# Patient Record
Sex: Male | Born: 1975 | Race: White | Hispanic: No | Marital: Married | Smoking: Never smoker
Health system: Southern US, Community
[De-identification: ages and names within clinical notes are randomized; demographics above are authoritative.]

## PROBLEM LIST (undated history)

## (undated) DIAGNOSIS — K429 Umbilical hernia without obstruction or gangrene: Secondary | ICD-10-CM

## (undated) DIAGNOSIS — Z8719 Personal history of other diseases of the digestive system: Secondary | ICD-10-CM

## (undated) DIAGNOSIS — Z8711 Personal history of peptic ulcer disease: Secondary | ICD-10-CM

## (undated) DIAGNOSIS — J302 Other seasonal allergic rhinitis: Secondary | ICD-10-CM

## (undated) HISTORY — PX: UPPER GI ENDOSCOPY: SHX6162

---

## 2006-01-17 ENCOUNTER — Encounter: Admission: RE | Admit: 2006-01-17 | Discharge: 2006-01-17 | Payer: Self-pay | Admitting: Internal Medicine

## 2006-01-26 ENCOUNTER — Encounter: Admission: RE | Admit: 2006-01-26 | Discharge: 2006-04-26 | Payer: Self-pay | Admitting: Internal Medicine

## 2011-09-15 ENCOUNTER — Emergency Department (HOSPITAL_BASED_OUTPATIENT_CLINIC_OR_DEPARTMENT_OTHER)
Admission: EM | Admit: 2011-09-15 | Discharge: 2011-09-15 | Disposition: A | Discharge status: Home / Self Care | Payer: BC Managed Care – PPO | Attending: Emergency Medicine | Admitting: Emergency Medicine

## 2011-09-15 ENCOUNTER — Encounter: Payer: Self-pay | Admitting: Family Medicine

## 2011-09-15 ENCOUNTER — Emergency Department (INDEPENDENT_AMBULATORY_CARE_PROVIDER_SITE_OTHER): Payer: BC Managed Care – PPO

## 2011-09-15 DIAGNOSIS — K5732 Diverticulitis of large intestine without perforation or abscess without bleeding: Secondary | ICD-10-CM

## 2011-09-15 DIAGNOSIS — K5792 Diverticulitis of intestine, part unspecified, without perforation or abscess without bleeding: Secondary | ICD-10-CM

## 2011-09-15 DIAGNOSIS — R1032 Left lower quadrant pain: Secondary | ICD-10-CM | POA: Insufficient documentation

## 2011-09-15 LAB — CBC
MCH: 31 pg (ref 26.0–34.0)
Platelets: 244 10*3/uL (ref 150–400)
RBC: 5.09 MIL/uL (ref 4.22–5.81)
WBC: 9.6 10*3/uL (ref 4.0–10.5)

## 2011-09-15 LAB — URINALYSIS, ROUTINE W REFLEX MICROSCOPIC
Bilirubin Urine: NEGATIVE
Hgb urine dipstick: NEGATIVE
Ketones, ur: NEGATIVE mg/dL
Nitrite: NEGATIVE
Protein, ur: NEGATIVE mg/dL
Specific Gravity, Urine: 1.01 (ref 1.005–1.030)
Urobilinogen, UA: 1 mg/dL (ref 0.0–1.0)
pH: 7.5 (ref 5.0–8.0)

## 2011-09-15 LAB — COMPREHENSIVE METABOLIC PANEL
AST: 25 U/L (ref 0–37)
Albumin: 4.5 g/dL (ref 3.5–5.2)
Alkaline Phosphatase: 69 U/L (ref 39–117)
CO2: 30 mEq/L (ref 19–32)
Calcium: 9.4 mg/dL (ref 8.4–10.5)
GFR calc Af Amer: >90 mL/min (ref >90)
GFR calc non Af Amer: >90 mL/min (ref >90)
Glucose, Bld: 100 mg/dL — ABNORMAL HIGH (ref 70–99)
Sodium: 141 mEq/L (ref 135–145)
Total Bilirubin: 0.8 mg/dL (ref 0.3–1.2)

## 2011-09-15 LAB — DIFFERENTIAL
Basophils Absolute: 0 10*3/uL (ref 0.0–0.1)
Eosinophils Absolute: 0.2 10*3/uL (ref 0.0–0.7)
Lymphocytes Relative: 17 % (ref 12–46)
Lymphs Abs: 1.6 10*3/uL (ref 0.7–4.0)
Monocytes Relative: 8 % (ref 3–12)
Neutrophils Relative %: 73 % (ref 43–77)

## 2011-09-15 LAB — LIPASE, BLOOD: Lipase: 19 U/L (ref 11–59)

## 2011-09-15 MED ORDER — CIPROFLOXACIN HCL 500 MG PO TABS: 500.0000 mg | ORAL_TABLET | Freq: Two times a day (BID) | ORAL | Status: AC

## 2011-09-15 MED ORDER — IOHEXOL 300 MG/ML  SOLN
100.0000 mL | Freq: Once | INTRAMUSCULAR | Status: AC | PRN
  Administered 2011-09-15: 100 mL via INTRAVENOUS

## 2011-09-15 MED ORDER — SODIUM CHLORIDE 0.9 % IV BOLUS (SEPSIS)
1000.0000 mL | Freq: Once | INTRAVENOUS | Status: AC
  Administered 2011-09-15: 1000 mL via INTRAVENOUS

## 2011-09-15 MED ORDER — METRONIDAZOLE 500 MG PO TABS: 500.0000 mg | ORAL_TABLET | Freq: Two times a day (BID) | ORAL | Status: AC

## 2011-09-15 MED ORDER — METRONIDAZOLE 500 MG PO TABS
500.0000 mg | ORAL_TABLET | Freq: Once | ORAL | Status: AC
  Administered 2011-09-15: 500 mg via ORAL
  Filled 2011-09-15: qty 1

## 2011-09-15 MED ORDER — OXYCODONE-ACETAMINOPHEN 5-325 MG PO TABS: 1.0000 | ORAL_TABLET | Freq: Four times a day (QID) | ORAL | Status: AC | PRN

## 2011-09-15 MED ORDER — CIPROFLOXACIN HCL 500 MG PO TABS
500.0000 mg | ORAL_TABLET | Freq: Once | ORAL | Status: AC
  Administered 2011-09-15: 500 mg via ORAL
  Filled 2011-09-15: qty 1

## 2011-09-15 NOTE — ED Notes (Signed)
Pt c/o LLQ pain since Friday night, pt sts "I initially thought it was a muscle strain". Pt reports "ribbon-like stool". Pt denies n/v, fever. Pt reports increased pain while "bending over". Pt sts "gastritis when I was young".

## 2011-09-15 NOTE — ED Provider Notes (Signed)
History     CSN: [id number] Arrival date & time: 09/15/2011 10:20 AM  Chief Complaint  Patient presents with  . Abdominal Pain    (Consider location/radiation/quality/duration/timing/severity/associated sxs/prior treatment) HPI Pt reports 2-3 days of moderate aching LLQ abdominal pain, persistent during that time but now sharp and getting worse this morning. Pain is worse with movements such as bending over and associated with change in stool caliber but no blood in stool. He has not had any nausea or vomiting. No fever.  Past Medical History  Diagnosis Date  . Gastritis     History reviewed. No pertinent past surgical history.  No family history on file.  History  Substance Use Topics  . Smoking status: Never Smoker   . Smokeless tobacco: Not on file  . Alcohol Use: No      Review of Systems All other systems reviewed and are negative except as noted in HPI.   Allergies  Review of patient's allergies indicates no known allergies.  Home Medications  No current outpatient prescriptions on file.  BP 134/89  Pulse 70  Temp(Src) 97.1 F (36.2 C) (Oral)  Resp 16  Ht 5\' 10"  (1.778 m)  Wt 203 lb (92.08 kg)  BMI 29.13 kg/m2  SpO2 100%  Physical Exam  Nursing note and vitals reviewed. Constitutional: He is oriented to person, place, and time. He appears well-developed and well-nourished.  HENT:  Head: Normocephalic and atraumatic.  Eyes: EOM are normal. Pupils are equal, round, and reactive to light.  Neck: Normal range of motion. Neck supple.  Cardiovascular: Normal rate, normal heart sounds and intact distal pulses.   Pulmonary/Chest: Effort normal and breath sounds normal.  Abdominal: Soft. Bowel sounds are normal. He exhibits no distension and no mass. There is tenderness (LLQ tenderness mild-moderate). There is guarding. There is no rebound.  Musculoskeletal: Normal range of motion. He exhibits no edema and no tenderness.  Neurological: He is alert and  oriented to person, place, and time. He has normal strength. No cranial nerve deficit or sensory deficit.  Skin: Skin is warm and dry. No rash noted.  Psychiatric: He has a normal mood and affect.    ED Course  Procedures (including critical care time)  Labs Reviewed  COMPREHENSIVE METABOLIC PANEL - Abnormal; Notable for the following:    Glucose, Bld 100 (*)    All other components within normal limits  URINALYSIS, ROUTINE W REFLEX MICROSCOPIC  CBC  DIFFERENTIAL  LIPASE, BLOOD   Ct Abdomen Pelvis W Contrast  09/15/2011  *RADIOLOGY REPORT*  Clinical Data: Left lower quadrant abdominal pain  CT ABDOMEN AND PELVIS WITH CONTRAST  Technique:  Multidetector CT imaging of the abdomen and pelvis was performed following the standard protocol during bolus administration of intravenous contrast.  Contrast: OMNIPAQUE IOHEXOL 300 MG/ML IV SOLN  Comparison: None.  Findings: Mild dependent atelectasis at the lung bases.  Liver, spleen, pancreas, and adrenal glands are within normal limits.  Gallbladder is unremarkable.  No intrahepatic or extrahepatic ductal dilatation.  Kidneys are within normal limits.  No hydronephrosis.  No evidence of bowel obstruction.  Normal appendix.  Colonic diverticulosis with mild inflammatory stranding involving a loop of sigmoid colon (series 2/image 74), compatible with mild sigmoid diverticulitis.  No drainable fluid collection or abscess.  No free air.  No evidence of abdominal aortic aneurysm.  No abdominopelvic ascites.  No suspicious abdominopelvic lymphadenopathy.  Prostate is unremarkable.  Bladder is within normal limits.  Visualized osseous structures are within normal limits.  IMPRESSION: Mild sigmoid diverticulitis.  No drainable fluid collection or abscess.  No free air.  Original Report Authenticated By: Charline Bills, M.D.   Impression  1. Diverticulitis       MDM  CT shows mild diverticulitis consistent with history and exam. Will treat as  outpatient with oral Abx and pain meds. Advised to return to the ED for any worsening pain, vomiting, fever or for any other concerns.         Charles B. Bernette Mayers, MD 09/15/11 1234

## 2015-02-06 DIAGNOSIS — K429 Umbilical hernia without obstruction or gangrene: Secondary | ICD-10-CM

## 2015-02-06 HISTORY — DX: Umbilical hernia without obstruction or gangrene: K42.9

## 2015-02-09 ENCOUNTER — Encounter (HOSPITAL_BASED_OUTPATIENT_CLINIC_OR_DEPARTMENT_OTHER): Payer: Self-pay | Admitting: *Deleted

## 2015-02-09 ENCOUNTER — Ambulatory Visit (INDEPENDENT_AMBULATORY_CARE_PROVIDER_SITE_OTHER): Payer: Self-pay | Admitting: General Surgery

## 2015-02-09 NOTE — H&P (Signed)
Quame Strength 01/04/2015 3:06 PM Location: [facility] Patient #: [id number] DOB: 01/19/1976 Married / Language: English / Race: White Male  History of Present Illness (Raena Pau M. Zameria Vogl MD; 01/04/2015 3:43 PM) Patient words: eval umb hernia.  The patient is a 39 year old male who presents with an umbilical hernia. He is referred by Dr. Dean Mitchell. He states that he probably has had it for 2 years. He reports that he felt something pop while swinging his daughter on a swing set. He reports that it will cause some intermittent discomfort. For example he had a case of gastroenteritis last week which caused a fair amount of abdominal bloating and pressure which made his umbilicus hurt. It also made him leave work early last week. For the most part it doesn't cause significant pain or discomfort. He denies any fever, chills, nausea, vomiting, diarrhea or constipation. He denies any prior abdominal surgery. He does have problems with back pain   Other Problems (Fatumata Kashani M Jossalin Chervenak, MD; 01/04/2015 3:44 PM) Back Pain Diverticulosis Gastric Ulcer Hemorrhoids Umbilical Hernia Repair UMBILICAL HERNIA WITHOUT OBSTRUCTION AND WITHOUT GANGRENE (553.1  K42.9)  Past Surgical History (Kendall Moffitt, MA; 01/04/2015 3:06 PM) No pertinent past surgical history  Diagnostic Studies History (Kendall Moffitt, MA; 01/04/2015 3:06 PM) Colonoscopy never  Allergies (Kendall Moffitt, MA; 01/04/2015 3:07 PM) No Known Drug Allergies01/28/2016  Medication History (Kendall Moffitt, MA; 01/04/2015 3:07 PM) Azelastine HCl (0.15% Solution, Nasal as needed) Active. Diclofenac Sodium (75MG Tablet DR, Oral daily) Active. Dymista (137-50MCG/ACT Suspension, Nasal) Active.  Social History (Kendall Moffitt, MA; 01/04/2015 3:06 PM) Alcohol use Occasional alcohol use. Caffeine use Carbonated beverages, Coffee, Tea. No drug use Tobacco use Never smoker.  Family History (Kendall Moffitt, MA;  01/04/2015 3:06 PM) Colon Polyps Father. Heart disease in male family member before age 65 Heart disease in male family member before age 55  Review of Systems (Kendall Moffitt MA; 01/04/2015 3:06 PM) General Not Present- Appetite Loss, Chills, Fatigue, Fever, Night Sweats, Weight Gain and Weight Loss. Skin Not Present- Change in Wart/Mole, Dryness, Hives, Jaundice, New Lesions, Non-Healing Wounds, Rash and Ulcer. HEENT Not Present- Earache, Hearing Loss, Hoarseness, Nose Bleed, Oral Ulcers, Ringing in the Ears, Seasonal Allergies, Sinus Pain, Sore Throat, Visual Disturbances, Wears glasses/contact lenses and Yellow Eyes. Respiratory Not Present- Bloody sputum, Chronic Cough, Difficulty Breathing, Snoring and Wheezing. Breast Not Present- Breast Mass, Breast Pain, Nipple Discharge and Skin Changes. Cardiovascular Not Present- Chest Pain, Difficulty Breathing Lying Down, Leg Cramps, Palpitations, Rapid Heart Rate, Shortness of Breath and Swelling of Extremities. Gastrointestinal Present- Abdominal Pain. Not Present- Bloating, Bloody Stool, Change in Bowel Habits, Chronic diarrhea, Constipation, Difficulty Swallowing, Excessive gas, Gets full quickly at meals, Hemorrhoids, Indigestion, Nausea, Rectal Pain and Vomiting. Male Genitourinary Not Present- Blood in Urine, Change in Urinary Stream, Frequency, Impotence, Nocturia, Painful Urination, Urgency and Urine Leakage. Musculoskeletal Present- Back Pain and Joint Pain. Not Present- Joint Stiffness, Muscle Pain, Muscle Weakness and Swelling of Extremities. Neurological Not Present- Decreased Memory, Fainting, Headaches, Numbness, Seizures, Tingling, Tremor, Trouble walking and Weakness. Psychiatric Not Present- Anxiety, Bipolar, Change in Sleep Pattern, Depression, Fearful and Frequent crying. Endocrine Not Present- Cold Intolerance, Excessive Hunger, Hair Changes, Heat Intolerance, Hot flashes and New Diabetes. Hematology Not Present- Easy  Bruising, Excessive bleeding, Gland problems, HIV and Persistent Infections.   Vitals (Kendall Moffitt MA; 01/04/2015 3:07 PM) 01/04/2015 3:07 PM Weight: 208.6 lb Height: 70in Body Surface Area: 2.16 m Body Mass Index: 29.93 kg/m Temp.: 98.3F(Oral)  Pulse: 64 (Regular)    Resp.: 16 (Unlabored)  BP: 142/84 (Sitting, Left Arm, Standard)    Physical Exam (Kerry-Anne Mezo M. Laurel Smeltz MD; 01/04/2015 3:41 PM) General Mental Status-Alert. General Appearance-Consistent with stated age. Hydration-Well hydrated. Voice-Normal.  Head and Neck Head-normocephalic, atraumatic with no lesions or palpable masses. Trachea-midline. Thyroid Gland Characteristics - normal size and consistency.  Eye Eyeball - Bilateral-Extraocular movements intact. Sclera/Conjunctiva - Bilateral-No scleral icterus.  Chest and Lung Exam Chest and lung exam reveals -quiet, even and easy respiratory effort with no use of accessory muscles and on auscultation, normal breath sounds, no adventitious sounds and normal vocal resonance. Inspection Chest Wall - Normal. Back - normal.  Breast - Did not examine.  Cardiovascular Cardiovascular examination reveals -normal heart sounds, regular rate and rhythm with no murmurs and normal pedal pulses bilaterally.  Abdomen Inspection  Inspection of the abdomen reveals: Note: small umbilical hernia; about 0.7cm. flat. nothing protruding. Skin - Scar - no surgical scars. Palpation/Percussion Palpation and Percussion of the abdomen reveal - Soft, Non Tender, No Rebound tenderness, No Rigidity (guarding) and No hepatosplenomegaly. Auscultation Auscultation of the abdomen reveals - Bowel sounds normal.  Peripheral Vascular Upper Extremity Palpation - Pulses bilaterally normal.  Neurologic Neurologic evaluation reveals -alert and oriented x 3 with no impairment of recent or remote memory. Mental Status-Normal.  Neuropsychiatric The patient's mood  and affect are described as -normal. Judgment and Insight-insight is appropriate concerning matters relevant to self.  Musculoskeletal Normal Exam - Left-Upper Extremity Strength Normal and Lower Extremity Strength Normal. Normal Exam - Right-Upper Extremity Strength Normal and Lower Extremity Strength Normal.  Lymphatic Head & Neck  General Head & Neck Lymphatics: Bilateral - Description - Normal. Axillary - Did not examine. Femoral & Inguinal - Did not examine.    Assessment & Plan (Maanav Kassabian M. Bailynn Dyk MD; 01/04/2015 3:40 PM) UMBILICAL HERNIA WITHOUT OBSTRUCTION AND WITHOUT GANGRENE (553.1  K42.9) Current Plans  Schedule for Surgery We discussed the etiology of umbilical hernias. We discussed the signs and symptoms of incarceration and strangulation. The patient was given educational material. I also drew diagrams.  We discussed nonoperative and operative management. With respect to operative management, we discussed open repair  We discussed the risk and benefits of surgery including but not limited to bleeding, infection, injury to surrounding structures, hernia recurrence, mesh complications, hematoma/seroma formation, blood clot formation, urinary retention, post operative ileus, general anesthesia risk, abdominal pain. We discussed the importance of avoiding heavy lifting and straining for a period of 4- 6 weeks.  The patient has elected to proceed with OPEN REPAIR OF UMBILICAL HERNIA, POSSIBLE MESH.  Floretta Petro M. Renn Stille, MD, FACS General, Bariatric, & Minimally Invasive Surgery [facility]  

## 2015-02-16 ENCOUNTER — Ambulatory Visit (HOSPITAL_BASED_OUTPATIENT_CLINIC_OR_DEPARTMENT_OTHER): Payer: BLUE CROSS/BLUE SHIELD | Admitting: Anesthesiology

## 2015-02-16 ENCOUNTER — Ambulatory Visit (HOSPITAL_BASED_OUTPATIENT_CLINIC_OR_DEPARTMENT_OTHER)
Admission: RE | Admit: 2015-02-16 | Discharge: 2015-02-16 | Disposition: A | Discharge status: Home / Self Care | Payer: BLUE CROSS/BLUE SHIELD | Source: Ambulatory Visit | Attending: General Surgery | Admitting: General Surgery

## 2015-02-16 ENCOUNTER — Encounter (HOSPITAL_BASED_OUTPATIENT_CLINIC_OR_DEPARTMENT_OTHER): Payer: Self-pay

## 2015-02-16 ENCOUNTER — Encounter (HOSPITAL_BASED_OUTPATIENT_CLINIC_OR_DEPARTMENT_OTHER)
Admission: RE | Disposition: A | Discharge status: Home / Self Care | Payer: Self-pay | Source: Ambulatory Visit | Attending: General Surgery

## 2015-02-16 DIAGNOSIS — Z6829 Body mass index (BMI) 29.0-29.9, adult: Secondary | ICD-10-CM | POA: Insufficient documentation

## 2015-02-16 DIAGNOSIS — E669 Obesity, unspecified: Secondary | ICD-10-CM | POA: Insufficient documentation

## 2015-02-16 DIAGNOSIS — K429 Umbilical hernia without obstruction or gangrene: Secondary | ICD-10-CM | POA: Diagnosis not present

## 2015-02-16 DIAGNOSIS — Z79899 Other long term (current) drug therapy: Secondary | ICD-10-CM | POA: Insufficient documentation

## 2015-02-16 DIAGNOSIS — K259 Gastric ulcer, unspecified as acute or chronic, without hemorrhage or perforation: Secondary | ICD-10-CM | POA: Diagnosis not present

## 2015-02-16 HISTORY — DX: Personal history of other diseases of the digestive system: Z87.19

## 2015-02-16 HISTORY — PX: UMBILICAL HERNIA REPAIR: SHX196

## 2015-02-16 HISTORY — DX: Personal history of peptic ulcer disease: Z87.11

## 2015-02-16 HISTORY — DX: Other seasonal allergic rhinitis: J30.2

## 2015-02-16 HISTORY — DX: Umbilical hernia without obstruction or gangrene: K42.9

## 2015-02-16 LAB — POCT HEMOGLOBIN-HEMACUE: Hemoglobin: 16.4 g/dL (ref 13.0–17.0)

## 2015-02-16 SURGERY — REPAIR, HERNIA, UMBILICAL, ADULT
Anesthesia: General | Site: Abdomen

## 2015-02-16 MED ORDER — FENTANYL CITRATE 0.05 MG/ML IJ SOLN
INTRAMUSCULAR | Status: DC | PRN
  Administered 2015-02-16 (×4): 25 ug via INTRAVENOUS
  Administered 2015-02-16: 100 ug via INTRAVENOUS

## 2015-02-16 MED ORDER — 0.9 % SODIUM CHLORIDE (POUR BTL) OPTIME
TOPICAL | Status: DC | PRN
  Administered 2015-02-16: 200 mL

## 2015-02-16 MED ORDER — PROMETHAZINE HCL 25 MG/ML IJ SOLN: 6.2500 mg | INTRAMUSCULAR | Status: DC | PRN

## 2015-02-16 MED ORDER — SODIUM CHLORIDE 0.9 % IV SOLN
250.0000 mL | INTRAVENOUS | Status: DC | PRN
Start: 2015-02-16 — End: 2015-02-16

## 2015-02-16 MED ORDER — MIDAZOLAM HCL 2 MG/2ML IJ SOLN
INTRAMUSCULAR | Status: AC
  Filled 2015-02-16: qty 2

## 2015-02-16 MED ORDER — DEXAMETHASONE SODIUM PHOSPHATE 4 MG/ML IJ SOLN
INTRAMUSCULAR | Status: DC | PRN
  Administered 2015-02-16: 10 mg via INTRAVENOUS

## 2015-02-16 MED ORDER — PROPOFOL 10 MG/ML IV BOLUS
INTRAVENOUS | Status: AC
  Filled 2015-02-16: qty 20

## 2015-02-16 MED ORDER — CEFAZOLIN SODIUM-DEXTROSE 2-3 GM-% IV SOLR
INTRAVENOUS | Status: AC
  Filled 2015-02-16: qty 50

## 2015-02-16 MED ORDER — OXYCODONE HCL 5 MG PO TABS: 5.0000 mg | ORAL_TABLET | ORAL | Status: AC | PRN

## 2015-02-16 MED ORDER — MEPERIDINE HCL 25 MG/ML IJ SOLN: 6.2500 mg | INTRAMUSCULAR | Status: DC | PRN

## 2015-02-16 MED ORDER — HYDROMORPHONE HCL 1 MG/ML IJ SOLN
INTRAMUSCULAR | Status: AC
  Filled 2015-02-16: qty 1

## 2015-02-16 MED ORDER — ACETAMINOPHEN 325 MG PO TABS: 650.0000 mg | ORAL_TABLET | ORAL | Status: DC | PRN

## 2015-02-16 MED ORDER — BUPIVACAINE-EPINEPHRINE (PF) 0.25% -1:200000 IJ SOLN
INTRAMUSCULAR | Status: DC | PRN
  Administered 2015-02-16: 30 mL

## 2015-02-16 MED ORDER — SODIUM CHLORIDE 0.9 % IJ SOLN: 3.0000 mL | INTRAMUSCULAR | Status: DC | PRN

## 2015-02-16 MED ORDER — KETOROLAC TROMETHAMINE 30 MG/ML IJ SOLN: 30.0000 mg | Freq: Four times a day (QID) | INTRAMUSCULAR | Status: DC

## 2015-02-16 MED ORDER — MIDAZOLAM HCL 2 MG/2ML IJ SOLN: 0.5000 mg | Freq: Once | INTRAMUSCULAR | Status: DC | PRN

## 2015-02-16 MED ORDER — CEFAZOLIN SODIUM-DEXTROSE 2-3 GM-% IV SOLR
2.0000 g | INTRAVENOUS | Status: AC
  Administered 2015-02-16: 2 g via INTRAVENOUS

## 2015-02-16 MED ORDER — MORPHINE SULFATE 2 MG/ML IJ SOLN: 1.0000 mg | INTRAMUSCULAR | Status: DC | PRN

## 2015-02-16 MED ORDER — SODIUM CHLORIDE 0.9 % IV SOLN
INTRAVENOUS | Status: DC
Start: 2015-02-16 — End: 2015-02-16

## 2015-02-16 MED ORDER — MIDAZOLAM HCL 5 MG/5ML IJ SOLN
INTRAMUSCULAR | Status: DC | PRN
  Administered 2015-02-16: 2 mg via INTRAVENOUS

## 2015-02-16 MED ORDER — MIDAZOLAM HCL 2 MG/2ML IJ SOLN: 1.0000 mg | INTRAMUSCULAR | Status: DC | PRN

## 2015-02-16 MED ORDER — LACTATED RINGERS IV SOLN
INTRAVENOUS | Status: DC
  Administered 2015-02-16 (×2): via INTRAVENOUS

## 2015-02-16 MED ORDER — SODIUM CHLORIDE 0.9 % IJ SOLN: 3.0000 mL | Freq: Two times a day (BID) | INTRAMUSCULAR | Status: DC

## 2015-02-16 MED ORDER — ACETAMINOPHEN 650 MG RE SUPP: 650.0000 mg | RECTAL | Status: DC | PRN

## 2015-02-16 MED ORDER — BUPIVACAINE-EPINEPHRINE (PF) 0.25% -1:200000 IJ SOLN
INTRAMUSCULAR | Status: AC
  Filled 2015-02-16: qty 30

## 2015-02-16 MED ORDER — FENTANYL CITRATE 0.05 MG/ML IJ SOLN: 50.0000 ug | INTRAMUSCULAR | Status: DC | PRN

## 2015-02-16 MED ORDER — OXYCODONE HCL 5 MG PO TABS: 5.0000 mg | ORAL_TABLET | ORAL | Status: DC | PRN

## 2015-02-16 MED ORDER — CHLORHEXIDINE GLUCONATE 4 % EX LIQD: 1.0000 "application " | Freq: Once | CUTANEOUS | Status: DC

## 2015-02-16 MED ORDER — EPHEDRINE SULFATE 50 MG/ML IJ SOLN
INTRAMUSCULAR | Status: DC | PRN
  Administered 2015-02-16 (×2): 10 mg via INTRAVENOUS

## 2015-02-16 MED ORDER — PROPOFOL 10 MG/ML IV BOLUS
INTRAVENOUS | Status: DC | PRN
  Administered 2015-02-16: 200 mg via INTRAVENOUS

## 2015-02-16 MED ORDER — HYDROMORPHONE HCL 1 MG/ML IJ SOLN
0.2500 mg | INTRAMUSCULAR | Status: DC | PRN
  Administered 2015-02-16 (×2): 0.5 mg via INTRAVENOUS

## 2015-02-16 MED ORDER — KETOROLAC TROMETHAMINE 30 MG/ML IJ SOLN
INTRAMUSCULAR | Status: DC | PRN
  Administered 2015-02-16: 30 mg via INTRAVENOUS

## 2015-02-16 MED ORDER — FENTANYL CITRATE 0.05 MG/ML IJ SOLN
INTRAMUSCULAR | Status: AC
  Filled 2015-02-16: qty 4

## 2015-02-16 SURGICAL SUPPLY — 47 items
APL SKNCLS STERI-STRIP NONHPOA (GAUZE/BANDAGES/DRESSINGS) ×2
BENZOIN TINCTURE PRP APPL 2/3 (GAUZE/BANDAGES/DRESSINGS) ×4 IMPLANT
BLADE CLIPPER SURG (BLADE) ×4 IMPLANT
BLADE SURG 15 STRL LF DISP TIS (BLADE) ×2 IMPLANT
BLADE SURG 15 STRL SS (BLADE) ×4
CHLORAPREP W/TINT 26ML (MISCELLANEOUS) ×4 IMPLANT
CLOSURE WOUND 1/2 X4 (GAUZE/BANDAGES/DRESSINGS) ×1
COVER BACK TABLE 60X90IN (DRAPES) ×4 IMPLANT
COVER MAYO STAND STRL (DRAPES) ×4 IMPLANT
DECANTER SPIKE VIAL GLASS SM (MISCELLANEOUS) IMPLANT
DRAPE LAPAROTOMY 100X72 PEDS (DRAPES) ×4 IMPLANT
DRAPE UTILITY XL STRL (DRAPES) ×4 IMPLANT
DRSG TEGADERM 4X4.75 (GAUZE/BANDAGES/DRESSINGS) ×4 IMPLANT
ELECT COATED BLADE 2.86 ST (ELECTRODE) ×4 IMPLANT
ELECT REM PT RETURN 9FT ADLT (ELECTROSURGICAL) ×4
ELECTRODE REM PT RTRN 9FT ADLT (ELECTROSURGICAL) ×2 IMPLANT
GLOVE BIO SURGEON STRL SZ7.5 (GLOVE) ×4 IMPLANT
GLOVE BIOGEL PI IND STRL 7.0 (GLOVE) ×2 IMPLANT
GLOVE BIOGEL PI IND STRL 8 (GLOVE) ×4 IMPLANT
GLOVE BIOGEL PI INDICATOR 7.0 (GLOVE) ×2
GLOVE BIOGEL PI INDICATOR 8 (GLOVE) ×4
GLOVE ECLIPSE 6.5 STRL STRAW (GLOVE) ×4 IMPLANT
GLOVE EXAM NITRILE LRG STRL (GLOVE) ×4 IMPLANT
GLOVE SURG SS PI 8.0 STRL IVOR (GLOVE) ×4 IMPLANT
GOWN STRL REUS W/ TWL LRG LVL3 (GOWN DISPOSABLE) IMPLANT
GOWN STRL REUS W/ TWL XL LVL3 (GOWN DISPOSABLE) ×6 IMPLANT
GOWN STRL REUS W/TWL LRG LVL3 (GOWN DISPOSABLE)
GOWN STRL REUS W/TWL XL LVL3 (GOWN DISPOSABLE) ×12
LIQUID BAND (GAUZE/BANDAGES/DRESSINGS) IMPLANT
NEEDLE HYPO 22GX1.5 SAFETY (NEEDLE) ×4 IMPLANT
NS IRRIG 1000ML POUR BTL (IV SOLUTION) ×4 IMPLANT
PACK BASIN DAY SURGERY FS (CUSTOM PROCEDURE TRAY) ×4 IMPLANT
PENCIL BUTTON HOLSTER BLD 10FT (ELECTRODE) ×4 IMPLANT
SLEEVE SCD COMPRESS KNEE MED (MISCELLANEOUS) ×4 IMPLANT
SPONGE LAP 4X18 X RAY DECT (DISPOSABLE) ×4 IMPLANT
STRIP CLOSURE SKIN 1/2X4 (GAUZE/BANDAGES/DRESSINGS) ×3 IMPLANT
SUT ETHIBOND 0 MO6 C/R (SUTURE) IMPLANT
SUT MON AB 4-0 PC3 18 (SUTURE) ×4 IMPLANT
SUT NOVA 0 T19/GS 22DT (SUTURE) ×4 IMPLANT
SUT VIC AB 0 SH 27 (SUTURE) IMPLANT
SUT VIC AB 2-0 SH 27 (SUTURE)
SUT VIC AB 2-0 SH 27XBRD (SUTURE) IMPLANT
SUT VICRYL 3-0 CR8 SH (SUTURE) ×4 IMPLANT
SUT VICRYL AB 3 0 TIES (SUTURE) IMPLANT
SYR CONTROL 10ML LL (SYRINGE) ×4 IMPLANT
TOWEL OR 17X24 6PK STRL BLUE (TOWEL DISPOSABLE) ×4 IMPLANT
TOWEL OR NON WOVEN STRL DISP B (DISPOSABLE) ×4 IMPLANT

## 2015-02-16 NOTE — Anesthesia Postprocedure Evaluation (Signed)
  Anesthesia Post-op Note  Patient: Patrick Cabrera  Procedure(s) Performed: Procedure(s): OPEN REPAIR UMBILICAL HERNIA  (N/A)  Patient Location: PACU  Anesthesia Type:General  Level of Consciousness: awake  Airway and Oxygen Therapy: Patient Spontanous Breathing  Post-op Pain: mild  Post-op Assessment: Post-op Vital signs reviewed  Post-op Vital Signs: Reviewed  Last Vitals:  Filed Vitals:   02/16/15 1115  BP: 132/84  Pulse: 83  Temp:   Resp: 15    Complications: No apparent anesthesia complications

## 2015-02-16 NOTE — Transfer of Care (Signed)
Immediate Anesthesia Transfer of Care Note  Patient: Patrick Cabrera  Procedure(s) Performed: Procedure(s): OPEN REPAIR UMBILICAL HERNIA  (N/A)  Patient Location: PACU  Anesthesia Type:General  Level of Consciousness: sedated  Airway & Oxygen Therapy: Patient Spontanous Breathing and Patient connected to face mask oxygen  Post-op Assessment: Report given to RN and Post -op Vital signs reviewed and stable  Post vital signs: Reviewed and stable  Last Vitals:  Filed Vitals:   02/16/15 1004  BP:   Pulse: 85  Temp:   Resp: 18    Complications: No apparent anesthesia complications

## 2015-02-16 NOTE — Anesthesia Procedure Notes (Signed)
Procedure Name: LMA Insertion Date/Time: 02/16/2015 9:04 AM Performed by: Burna CashONRAD, Vanda Waskey C Pre-anesthesia Checklist: Patient identified, Emergency Drugs available, Suction available and Patient being monitored Patient Re-evaluated:Patient Re-evaluated prior to inductionOxygen Delivery Method: Circle System Utilized Preoxygenation: Pre-oxygenation with 100% oxygen Intubation Type: IV induction Ventilation: Mask ventilation without difficulty LMA: LMA inserted LMA Size: 5.0 Number of attempts: 1 Airway Equipment and Method: Bite block Placement Confirmation: positive ETCO2 Tube secured with: Tape Dental Injury: Teeth and Oropharynx as per pre-operative assessment

## 2015-02-16 NOTE — Discharge Instructions (Signed)
[facility]  UMBILICAL OR INGUINAL HERNIA REPAIR: POST OP INSTRUCTIONS  Always review your discharge instruction sheet given to you by the facility where your surgery was performed. IF YOU HAVE DISABILITY OR FAMILY LEAVE FORMS, YOU MUST BRING THEM TO THE OFFICE FOR PROCESSING.   DO NOT GIVE THEM TO YOUR DOCTOR.  1. A  prescription for pain medication may be given to you upon discharge.  Take your pain medication as prescribed, if needed.  If narcotic pain medicine is not needed, then you may take acetaminophen (Tylenol) or ibuprofen (Advil) as needed. 2. Take your usually prescribed medications unless otherwise directed. 3. If you need a refill on your pain medication, please contact your pharmacy.  They will contact our office to request authorization. Prescriptions will not be filled after 5 pm or on week-ends. 4. You should follow a light diet the first 24 hours after arrival home, such as soup and crackers, etc.  Be sure to include lots of fluids daily.  Resume your normal diet the day after surgery. 5. Most patients will experience some swelling and bruising around the umbilicus or in the groin and scrotum.  Ice packs and reclining will help.  Swelling and bruising can take several days to resolve.  6. It is common to experience some constipation if taking pain medication after surgery.  Increasing fluid intake and taking a stool softener (such as Colace) will usually help or prevent this problem from occurring.  A mild laxative (Milk of Magnesia or Miralax) should be taken according to package directions if there are no bowel movements after 48 hours. 7. Unless discharge instructions indicate otherwise, you may remove your bandages 48 hours after surgery, and you may shower at that time.  You  have steri-strips (small skin tapes) in place directly over the incision.  These strips should be left on the skin for 7-10 days.   8. ACTIVITIES:  You may resume regular (light) daily  activities beginning the next day--such as daily self-care, walking, climbing stairs--gradually increasing activities as tolerated.  You may have sexual intercourse when it is comfortable.  Refrain from any heavy lifting or straining until approved by your doctor. a. You may drive when you are no longer taking prescription pain medication, you can comfortably wear a seatbelt, and you can safely maneuver your car and apply brakes. b. RETURN TO WORK: 1-2 weeks WITH RESTRICTIONS or 4-6 weeks WITHOUT RESTRICTIONS 9. You should see your doctor in the office for a follow-up appointment approximately 2-3 weeks after your surgery.  Make sure that you call for this appointment within a day or two after you arrive home to insure a convenient appointment time. 10. OTHER INSTRUCTIONS: DO NOT LIFT, PUSH, OR PULL ANYTHING GREATER THAN 10 POUNDS FOR AT LEAST 4 WEEKS    WHEN TO CALL YOUR DOCTOR: 1. Fever over 101.0 2. Inability to urinate 3. Nausea and/or vomiting 4. Extreme swelling or bruising 5. Continued bleeding from incision. 6. Increased pain, redness, or drainage from the incision  The clinic staff is available to answer your questions during regular business hours.  Please dont hesitate to call and ask to speak to one of the nurses for clinical concerns.  If you have a medical emergency, go to the nearest emergency room or call 911.  A surgeon from [facility] is always on call at the hospital   [address], [address], [place], [place]  [id number] ?  [address] [address], [place], [place]  [phone] ? [phone] ? FAX [phone] Web site: [email or web address]  Post Anesthesia Home Care Instructions  Activity: Get plenty of rest for the remainder of the day. A responsible adult should stay with you for 24 hours following the procedure.  For the next 24 hours, DO NOT: -Drive a car -Advertising copywriter -Drink alcoholic beverages -Take any medication unless  instructed by your physician -Make any legal decisions or sign important papers.  Meals: Start with liquid foods such as gelatin or soup. Progress to regular foods as tolerated. Avoid greasy, spicy, heavy foods. If nausea and/or vomiting occur, drink only clear liquids until the nausea and/or vomiting subsides. Call your physician if vomiting continues.  Special Instructions/Symptoms: Your throat may feel dry or sore from the anesthesia or the breathing tube placed in your throat during surgery. If this causes discomfort, gargle with warm salt water. The discomfort should disappear within 24 hours.

## 2015-02-16 NOTE — Interval H&P Note (Signed)
History and Physical Interval Note:  02/16/2015 8:52 AM  Samule Dryyan [place]  has presented today for surgery, with the diagnosis of Umbilical Hernia Repair  The various methods of treatment have been discussed with the patient and family. After consideration of risks, benefits and other options for treatment, the patient has consented to  Procedure(s): OPEN REPAIR UMBILICAL HERNIA WITH POSSIBLE MESH (N/A) INSERTION OF MESH (N/A) as a surgical intervention .  The patient's history has been reviewed, patient examined, no change in status, stable for surgery.  I have reviewed the patient's chart and labs.  Questions were answered to the patient's satisfaction.    Mary SellaEric M. Andrey CampanileWilson, MD, FACS General, Bariatric, & Minimally Invasive Surgery [facility] [place]   [facility],Tasheika Kitzmiller M

## 2015-02-16 NOTE — Op Note (Signed)
Umbilical Herniorrhaphy Procedure Note  Indications: Symptomatic umbilical hernia   Pre-operative Diagnosis: umbilical hernia   Post-operative Diagnosis: umbilical hernia   Surgeon: Mary SellaEric M. Andrey CampanileWilson, MD FACS   Assistants: none   Anesthesia: General endotracheal anesthesia and Local anesthesia 0.25%marcaine with epi   Procedure Details  The patient was seen in the Holding Room. The risks, benefits, complications, treatment options, and expected outcomes were discussed with the patient. The possibilities of reaction to medication, pulmonary aspiration, perforation of viscus, bleeding, recurrent infection, hernia recurrence, the need for additional procedures, failure to diagnose a condition, and creating a complication requiring transfusion or operation were discussed with the patient. The patient concurred with the proposed plan, giving informed consent. The site of surgery properly noted/marked.   The patient was [facility] to [facility], identified as Patrick Cabrera and the procedure verified as Umbilical Herniorrhaphy. A Time Out was held and the above information confirmed. The patient received IV antibiotics.   The patient was placed supine. After establishing general anesthesia, the abdomen was prepped and draped in standard fashion. 0.250% Marcaine with epinephrine was used to anesthetize the skin. A curvilinear infraumbilical incision was created. Dissection was carried down to the hernia sac located above the fascia and was mobilized from surrounding structures. Intact fascia was identified circumferentially around the defect. Skin and soft tissue was mobilized from the surface of the fascia in a circumferential manner. The blunt end of a debakey was swept underneath the fascia to ensure there were no adhesions to the anterior abdominal wall. The fascial defect was about 1 cm therefore I elected to place no mesh.  The fascia was then closed with 4 interrupted  0-novafil sutures. The cavity was irrigated and additional local was infiltrated in the subcutaneous tissue and fascia. The umbilical stalk was then tacked back down to the fascia with two 3-0 vicryl sutures. Hemostasis was confirmed. The soft tissue was irrigated and closed in layers with inverted interrupted 3-0 vicryl sutures for the deep dermis. The skin incision was closed with a 4-0 monocryl subcuticular closure. Steri-Strips followed by a 4x4 gauze and a tegaderm were applied at the end of the operation.   Instrument, sponge, and needle counts were correct prior to closure and at the conclusion of the case.   Findings:  A 1 cm fascial defect   Estimated Blood Loss: Minimal   Drains: none   Implants: none   Complications: None; patient tolerated the procedure well.   Disposition: PACU - hemodynamically stable.   Condition: stable  Mary SellaEric M. Andrey CampanileWilson, MD, FACS General, Bariatric, & Minimally Invasive Surgery [facility] [place]

## 2015-02-16 NOTE — H&P (View-Only) (Signed)
Patrick Cabrera 01/04/2015 3:06 PM Location: [facility] Patient #: [id number] DOB: 01/06/1976 Married / Language: [place] / Race: White Male  History of Present Illness Patrick Areola M. Karena Kinker MD; 01/04/2015 3:43 PM) Patient words: eval umb hernia.  The patient is a 39 year old male who presents with an umbilical hernia. He is referred by Dr. [name]. He states that he probably has had it for 2 years. He reports that he felt something pop while swinging his daughter on a swing set. He reports that it will cause some intermittent discomfort. For example he had a case of gastroenteritis last week which caused a fair amount of abdominal bloating and pressure which made his umbilicus hurt. It also made him leave work early last week. For the most part it doesn't cause significant pain or discomfort. He denies any fever, chills, nausea, vomiting, diarrhea or constipation. He denies any prior abdominal surgery. He does have problems with back pain   Other Problems Atilano Ina, MD; 01/04/2015 3:44 PM) Back Pain Diverticulosis Gastric Ulcer Hemorrhoids Umbilical Hernia Repair UMBILICAL HERNIA WITHOUT OBSTRUCTION AND WITHOUT GANGRENE (553.1  K42.9)  Past Surgical History Patrick Marvel, MA; 01/04/2015 3:06 PM) No pertinent past surgical history  Diagnostic Studies History Patrick Cabrera, [place]; 01/04/2015 3:06 PM) Colonoscopy never  Allergies Patrick Marvel, MA; 01/04/2015 3:07 PM) No Known Drug Allergies01/28/2016  Medication History Patrick Cabrera, [place]; 01/04/2015 3:07 PM) Azelastine HCl (0.15% Solution, Nasal as needed) Active. Diclofenac Sodium (  Tablet DR, Oral daily) Active. Dymista (137-50MCG/ACT Suspension, Nasal) Active.  Social History Patrick Cabrera [place], [place]; 01/04/2015 3:06 PM) Alcohol use Occasional alcohol use. Caffeine use Carbonated beverages, Coffee, Tea. No drug use Tobacco use Never smoker.  Family History Patrick Cabrera [place], [place];  01/04/2015 3:06 PM) Colon Polyps Father. Heart disease in male family member before age 40 Heart disease in male family member before age 36  Review of Systems Patrick Cabrera [place]; 01/04/2015 3:06 PM) General Not Present- Appetite Loss, Chills, Fatigue, Fever, Night Sweats, Weight Gain and Weight Loss. Skin Not Present- Change in Wart/Mole, Dryness, Hives, Jaundice, New Lesions, Non-Healing Wounds, Rash and Ulcer. HEENT Not Present- Earache, Hearing Loss, Hoarseness, Nose Bleed, Oral Ulcers, Ringing in the Ears, Seasonal Allergies, Sinus Pain, Sore Throat, Visual Disturbances, Wears glasses/contact lenses and Yellow Eyes. Respiratory Not Present- Bloody sputum, Chronic Cough, Difficulty Breathing, Snoring and Wheezing. Breast Not Present- Breast Mass, Breast Pain, Nipple Discharge and Skin Changes. Cardiovascular Not Present- Chest Pain, Difficulty Breathing Lying Down, Leg Cramps, Palpitations, Rapid Heart Rate, Shortness of Breath and Swelling of Extremities. Gastrointestinal Present- Abdominal Pain. Not Present- Bloating, Bloody Stool, Change in Bowel Habits, Chronic diarrhea, Constipation, Difficulty Swallowing, Excessive gas, Gets full quickly at meals, Hemorrhoids, Indigestion, Nausea, Rectal Pain and Vomiting. Male Genitourinary Not Present- Blood in Urine, Change in Urinary Stream, Frequency, Impotence, Nocturia, Painful Urination, Urgency and Urine Leakage. Musculoskeletal Present- Back Pain and Joint Pain. Not Present- Joint Stiffness, Muscle Pain, Muscle Weakness and Swelling of Extremities. Neurological Not Present- Decreased Memory, Fainting, Headaches, Numbness, Seizures, Tingling, Tremor, Trouble walking and Weakness. Psychiatric Not Present- Anxiety, Bipolar, Change in Sleep Pattern, Depression, Fearful and Frequent crying. Endocrine Not Present- Cold Intolerance, Excessive Hunger, Hair Changes, Heat Intolerance, Hot flashes and New Diabetes. Hematology Not Present- Easy  Bruising, Excessive bleeding, Gland problems, HIV and Persistent Infections.   Vitals Patrick Cabrera [place] MA; 01/04/2015 3:07 PM) 01/04/2015 3:07 PM Weight: 208.6 lb Height: 70in Body Surface Area: 2.16 m Body Mass Index: 29.93 kg/m Temp.: 98.81F(Oral)  Pulse: 64 (Regular)  Resp.: 16 (Unlabored)  BP: 142/84 (Sitting, Left Arm, Standard)    Physical Exam Patrick Areola([name] [name]; 01/04/2015 3:41 PM) General Mental Status-Alert. General Appearance-Consistent with stated age. Hydration-Well hydrated. Voice-Normal.  Head and Neck Head-normocephalic, atraumatic with no lesions or palpable masses. Trachea-midline. Thyroid Gland Characteristics - normal size and consistency.  Eye Eyeball - Bilateral-Extraocular movements intact. Sclera/Conjunctiva - Bilateral-No scleral icterus.  Chest and Lung Exam Chest and lung exam reveals -quiet, even and easy respiratory effort with no use of accessory muscles and on auscultation, normal breath sounds, no adventitious sounds and normal vocal resonance. Inspection Chest Wall - Normal. Back - normal.  Breast - Did not examine.  Cardiovascular Cardiovascular examination reveals -normal heart sounds, regular rate and rhythm with no murmurs and normal pedal pulses bilaterally.  Abdomen Inspection  Inspection of the abdomen reveals: Note: small umbilical hernia; about 0.7cm. flat. nothing protruding. Skin - Scar - no surgical scars. Palpation/Percussion Palpation and Percussion of the abdomen reveal - Soft, Non Tender, No Rebound tenderness, No Rigidity (guarding) and No hepatosplenomegaly. Auscultation Auscultation of the abdomen reveals - Bowel sounds normal.  Peripheral Vascular Upper Extremity Palpation - Pulses bilaterally normal.  Neurologic Neurologic evaluation reveals -alert and oriented x 3 with no impairment of recent or remote memory. Mental Status-Normal.  Neuropsychiatric The patient's mood  and affect are described as -normal. Judgment and Insight-insight is appropriate concerning matters relevant to self.  Musculoskeletal Normal Exam - Left-Upper Extremity Strength Normal and Lower Extremity Strength Normal. Normal Exam - Right-Upper Extremity Strength Normal and Lower Extremity Strength Normal.  Lymphatic Head & Neck  General Head & Neck Lymphatics: Bilateral - Description - Normal. Axillary - Did not examine. Femoral & Inguinal - Did not examine.    Assessment & Plan Patrick Areola(Maya Arcand M. Dyer Klug MD; 01/04/2015 3:40 PM) UMBILICAL HERNIA WITHOUT OBSTRUCTION AND WITHOUT GANGRENE (553.1  K42.9) Current Plans  Schedule for Surgery We discussed the etiology of umbilical hernias. We discussed the signs and symptoms of incarceration and strangulation. The patient was given educational material. I also drew diagrams.  We discussed nonoperative and operative management. With respect to operative management, we discussed open repair  We discussed the risk and benefits of surgery including but not limited to bleeding, infection, injury to surrounding structures, hernia recurrence, mesh complications, hematoma/seroma formation, blood clot formation, urinary retention, post operative ileus, general anesthesia risk, abdominal pain. We discussed the importance of avoiding heavy lifting and straining for a period of 4- 6 weeks.  The patient has elected to proceed with OPEN REPAIR OF UMBILICAL HERNIA, POSSIBLE MESH.  Mary SellaEric M. Andrey CampanileWilson, MD, FACS General, Bariatric, & Minimally Invasive Surgery [facility], [place]

## 2015-02-16 NOTE — Anesthesia Preprocedure Evaluation (Addendum)
Anesthesia Evaluation  Patient identified by MRN, date of birth, ID band Patient awake    Reviewed: Allergy & Precautions, NPO status , Patient's Chart, lab work & pertinent test results  History of Anesthesia Complications Negative for: history of anesthetic complications  Airway Mallampati: II  TM Distance: >3 FB Neck ROM: Full    Dental  (+) Teeth Intact, Dental Advisory Given   Pulmonary neg pulmonary ROS,  breath sounds clear to auscultation        Cardiovascular negative cardio ROS  Rhythm:Regular Rate:Normal     Neuro/Psych negative neurological ROS     GI/Hepatic negative GI ROS, Neg liver ROS,   Endo/Other  negative endocrine ROS  Renal/GU negative Renal ROS     Musculoskeletal   Abdominal (+) + obese,   Peds  Hematology negative hematology ROS (+)   Anesthesia Other Findings   Reproductive/Obstetrics                             Anesthesia Physical Anesthesia Plan  ASA: I  Anesthesia Plan: General   Post-op Pain Management:    Induction: Intravenous  Airway Management Planned: LMA  Additional Equipment:   Intra-op Plan:   Post-operative Plan:   Informed Consent: I have reviewed the patients History and Physical, chart, labs and discussed the procedure including the risks, benefits and alternatives for the proposed anesthesia with the patient or authorized representative who has indicated his/her understanding and acceptance.   Dental advisory given  Plan Discussed with: CRNA and Surgeon  Anesthesia Plan Comments: (Plan routine monitors, GA-LMA ok)        Anesthesia Quick Evaluation

## 2015-02-19 ENCOUNTER — Encounter (HOSPITAL_BASED_OUTPATIENT_CLINIC_OR_DEPARTMENT_OTHER): Payer: Self-pay | Admitting: General Surgery

## 2015-09-24 ENCOUNTER — Other Ambulatory Visit: Payer: Self-pay | Admitting: Family Medicine

## 2015-09-24 ENCOUNTER — Ambulatory Visit
Admission: RE | Admit: 2015-09-24 | Discharge: 2015-09-24 | Disposition: A | Discharge status: Home / Self Care | Payer: BLUE CROSS/BLUE SHIELD | Source: Ambulatory Visit | Attending: Family Medicine | Admitting: Family Medicine

## 2015-09-24 DIAGNOSIS — T1490XA Injury, unspecified, initial encounter: Secondary | ICD-10-CM

## 2016-12-12 DIAGNOSIS — J3089 Other allergic rhinitis: Secondary | ICD-10-CM | POA: Diagnosis not present

## 2016-12-12 DIAGNOSIS — J3081 Allergic rhinitis due to animal (cat) (dog) hair and dander: Secondary | ICD-10-CM | POA: Diagnosis not present

## 2016-12-12 DIAGNOSIS — J301 Allergic rhinitis due to pollen: Secondary | ICD-10-CM | POA: Diagnosis not present

## 2016-12-18 DIAGNOSIS — J301 Allergic rhinitis due to pollen: Secondary | ICD-10-CM | POA: Diagnosis not present

## 2016-12-18 DIAGNOSIS — J3081 Allergic rhinitis due to animal (cat) (dog) hair and dander: Secondary | ICD-10-CM | POA: Diagnosis not present

## 2016-12-19 DIAGNOSIS — J3089 Other allergic rhinitis: Secondary | ICD-10-CM | POA: Diagnosis not present

## 2016-12-23 DIAGNOSIS — J3081 Allergic rhinitis due to animal (cat) (dog) hair and dander: Secondary | ICD-10-CM | POA: Diagnosis not present

## 2016-12-23 DIAGNOSIS — J3089 Other allergic rhinitis: Secondary | ICD-10-CM | POA: Diagnosis not present

## 2016-12-23 DIAGNOSIS — J301 Allergic rhinitis due to pollen: Secondary | ICD-10-CM | POA: Diagnosis not present

## 2017-01-06 DIAGNOSIS — J3081 Allergic rhinitis due to animal (cat) (dog) hair and dander: Secondary | ICD-10-CM | POA: Diagnosis not present

## 2017-01-06 DIAGNOSIS — J3089 Other allergic rhinitis: Secondary | ICD-10-CM | POA: Diagnosis not present

## 2017-01-06 DIAGNOSIS — J301 Allergic rhinitis due to pollen: Secondary | ICD-10-CM | POA: Diagnosis not present

## 2017-01-27 DIAGNOSIS — J301 Allergic rhinitis due to pollen: Secondary | ICD-10-CM | POA: Diagnosis not present

## 2017-01-27 DIAGNOSIS — J3089 Other allergic rhinitis: Secondary | ICD-10-CM | POA: Diagnosis not present

## 2017-01-27 DIAGNOSIS — J3081 Allergic rhinitis due to animal (cat) (dog) hair and dander: Secondary | ICD-10-CM | POA: Diagnosis not present

## 2017-02-03 DIAGNOSIS — J301 Allergic rhinitis due to pollen: Secondary | ICD-10-CM | POA: Diagnosis not present

## 2017-02-03 DIAGNOSIS — J3089 Other allergic rhinitis: Secondary | ICD-10-CM | POA: Diagnosis not present

## 2017-02-03 DIAGNOSIS — J3081 Allergic rhinitis due to animal (cat) (dog) hair and dander: Secondary | ICD-10-CM | POA: Diagnosis not present

## 2017-02-11 DIAGNOSIS — J301 Allergic rhinitis due to pollen: Secondary | ICD-10-CM | POA: Diagnosis not present

## 2017-02-11 DIAGNOSIS — J3081 Allergic rhinitis due to animal (cat) (dog) hair and dander: Secondary | ICD-10-CM | POA: Diagnosis not present

## 2017-02-11 DIAGNOSIS — J3089 Other allergic rhinitis: Secondary | ICD-10-CM | POA: Diagnosis not present

## 2017-02-17 DIAGNOSIS — J301 Allergic rhinitis due to pollen: Secondary | ICD-10-CM | POA: Diagnosis not present

## 2017-02-17 DIAGNOSIS — J3081 Allergic rhinitis due to animal (cat) (dog) hair and dander: Secondary | ICD-10-CM | POA: Diagnosis not present

## 2017-02-17 DIAGNOSIS — J3089 Other allergic rhinitis: Secondary | ICD-10-CM | POA: Diagnosis not present

## 2017-03-02 DIAGNOSIS — J3081 Allergic rhinitis due to animal (cat) (dog) hair and dander: Secondary | ICD-10-CM | POA: Diagnosis not present

## 2017-03-02 DIAGNOSIS — J301 Allergic rhinitis due to pollen: Secondary | ICD-10-CM | POA: Diagnosis not present

## 2017-03-02 DIAGNOSIS — J3089 Other allergic rhinitis: Secondary | ICD-10-CM | POA: Diagnosis not present

## 2017-03-09 DIAGNOSIS — J3081 Allergic rhinitis due to animal (cat) (dog) hair and dander: Secondary | ICD-10-CM | POA: Diagnosis not present

## 2017-03-09 DIAGNOSIS — J301 Allergic rhinitis due to pollen: Secondary | ICD-10-CM | POA: Diagnosis not present

## 2017-03-09 DIAGNOSIS — J3089 Other allergic rhinitis: Secondary | ICD-10-CM | POA: Diagnosis not present

## 2017-03-11 DIAGNOSIS — J3081 Allergic rhinitis due to animal (cat) (dog) hair and dander: Secondary | ICD-10-CM | POA: Diagnosis not present

## 2017-03-11 DIAGNOSIS — J3089 Other allergic rhinitis: Secondary | ICD-10-CM | POA: Diagnosis not present

## 2017-03-11 DIAGNOSIS — J301 Allergic rhinitis due to pollen: Secondary | ICD-10-CM | POA: Diagnosis not present

## 2017-03-16 DIAGNOSIS — J3081 Allergic rhinitis due to animal (cat) (dog) hair and dander: Secondary | ICD-10-CM | POA: Diagnosis not present

## 2017-03-16 DIAGNOSIS — J301 Allergic rhinitis due to pollen: Secondary | ICD-10-CM | POA: Diagnosis not present

## 2017-03-16 DIAGNOSIS — J3089 Other allergic rhinitis: Secondary | ICD-10-CM | POA: Diagnosis not present

## 2017-03-23 DIAGNOSIS — J3081 Allergic rhinitis due to animal (cat) (dog) hair and dander: Secondary | ICD-10-CM | POA: Diagnosis not present

## 2017-03-23 DIAGNOSIS — J301 Allergic rhinitis due to pollen: Secondary | ICD-10-CM | POA: Diagnosis not present

## 2017-03-23 DIAGNOSIS — J3089 Other allergic rhinitis: Secondary | ICD-10-CM | POA: Diagnosis not present

## 2017-04-01 DIAGNOSIS — J301 Allergic rhinitis due to pollen: Secondary | ICD-10-CM | POA: Diagnosis not present

## 2017-04-01 DIAGNOSIS — J3081 Allergic rhinitis due to animal (cat) (dog) hair and dander: Secondary | ICD-10-CM | POA: Diagnosis not present

## 2017-04-01 DIAGNOSIS — J3089 Other allergic rhinitis: Secondary | ICD-10-CM | POA: Diagnosis not present

## 2017-04-02 DIAGNOSIS — B349 Viral infection, unspecified: Secondary | ICD-10-CM | POA: Diagnosis not present

## 2017-04-03 DIAGNOSIS — J3089 Other allergic rhinitis: Secondary | ICD-10-CM | POA: Diagnosis not present

## 2017-04-03 DIAGNOSIS — J301 Allergic rhinitis due to pollen: Secondary | ICD-10-CM | POA: Diagnosis not present

## 2017-04-03 DIAGNOSIS — J3081 Allergic rhinitis due to animal (cat) (dog) hair and dander: Secondary | ICD-10-CM | POA: Diagnosis not present

## 2017-04-10 DIAGNOSIS — J3081 Allergic rhinitis due to animal (cat) (dog) hair and dander: Secondary | ICD-10-CM | POA: Diagnosis not present

## 2017-04-10 DIAGNOSIS — J3089 Other allergic rhinitis: Secondary | ICD-10-CM | POA: Diagnosis not present

## 2017-04-10 DIAGNOSIS — J301 Allergic rhinitis due to pollen: Secondary | ICD-10-CM | POA: Diagnosis not present

## 2017-04-17 DIAGNOSIS — J301 Allergic rhinitis due to pollen: Secondary | ICD-10-CM | POA: Diagnosis not present

## 2017-04-17 DIAGNOSIS — J3089 Other allergic rhinitis: Secondary | ICD-10-CM | POA: Diagnosis not present

## 2017-04-17 DIAGNOSIS — J3081 Allergic rhinitis due to animal (cat) (dog) hair and dander: Secondary | ICD-10-CM | POA: Diagnosis not present

## 2017-04-29 DIAGNOSIS — J3081 Allergic rhinitis due to animal (cat) (dog) hair and dander: Secondary | ICD-10-CM | POA: Diagnosis not present

## 2017-04-29 DIAGNOSIS — J301 Allergic rhinitis due to pollen: Secondary | ICD-10-CM | POA: Diagnosis not present

## 2017-04-29 DIAGNOSIS — J3089 Other allergic rhinitis: Secondary | ICD-10-CM | POA: Diagnosis not present

## 2017-05-06 DIAGNOSIS — J3089 Other allergic rhinitis: Secondary | ICD-10-CM | POA: Diagnosis not present

## 2017-05-06 DIAGNOSIS — J301 Allergic rhinitis due to pollen: Secondary | ICD-10-CM | POA: Diagnosis not present

## 2017-05-06 DIAGNOSIS — J3081 Allergic rhinitis due to animal (cat) (dog) hair and dander: Secondary | ICD-10-CM | POA: Diagnosis not present

## 2017-05-21 DIAGNOSIS — J301 Allergic rhinitis due to pollen: Secondary | ICD-10-CM | POA: Diagnosis not present

## 2017-05-21 DIAGNOSIS — J3089 Other allergic rhinitis: Secondary | ICD-10-CM | POA: Diagnosis not present

## 2017-05-28 DIAGNOSIS — J301 Allergic rhinitis due to pollen: Secondary | ICD-10-CM | POA: Diagnosis not present

## 2017-05-28 DIAGNOSIS — J3081 Allergic rhinitis due to animal (cat) (dog) hair and dander: Secondary | ICD-10-CM | POA: Diagnosis not present

## 2017-05-28 DIAGNOSIS — J3089 Other allergic rhinitis: Secondary | ICD-10-CM | POA: Diagnosis not present

## 2017-06-04 DIAGNOSIS — J301 Allergic rhinitis due to pollen: Secondary | ICD-10-CM | POA: Diagnosis not present

## 2017-06-04 DIAGNOSIS — J3089 Other allergic rhinitis: Secondary | ICD-10-CM | POA: Diagnosis not present

## 2017-06-04 DIAGNOSIS — J3081 Allergic rhinitis due to animal (cat) (dog) hair and dander: Secondary | ICD-10-CM | POA: Diagnosis not present

## 2017-06-11 DIAGNOSIS — J3081 Allergic rhinitis due to animal (cat) (dog) hair and dander: Secondary | ICD-10-CM | POA: Diagnosis not present

## 2017-06-11 DIAGNOSIS — J3089 Other allergic rhinitis: Secondary | ICD-10-CM | POA: Diagnosis not present

## 2017-06-11 DIAGNOSIS — J301 Allergic rhinitis due to pollen: Secondary | ICD-10-CM | POA: Diagnosis not present

## 2017-06-17 DIAGNOSIS — J3081 Allergic rhinitis due to animal (cat) (dog) hair and dander: Secondary | ICD-10-CM | POA: Diagnosis not present

## 2017-06-17 DIAGNOSIS — J301 Allergic rhinitis due to pollen: Secondary | ICD-10-CM | POA: Diagnosis not present

## 2017-06-17 DIAGNOSIS — J3089 Other allergic rhinitis: Secondary | ICD-10-CM | POA: Diagnosis not present

## 2017-07-01 DIAGNOSIS — J3089 Other allergic rhinitis: Secondary | ICD-10-CM | POA: Diagnosis not present

## 2017-07-01 DIAGNOSIS — J301 Allergic rhinitis due to pollen: Secondary | ICD-10-CM | POA: Diagnosis not present

## 2017-07-01 DIAGNOSIS — J3081 Allergic rhinitis due to animal (cat) (dog) hair and dander: Secondary | ICD-10-CM | POA: Diagnosis not present

## 2017-07-09 DIAGNOSIS — J3081 Allergic rhinitis due to animal (cat) (dog) hair and dander: Secondary | ICD-10-CM | POA: Diagnosis not present

## 2017-07-09 DIAGNOSIS — J3089 Other allergic rhinitis: Secondary | ICD-10-CM | POA: Diagnosis not present

## 2017-07-09 DIAGNOSIS — J301 Allergic rhinitis due to pollen: Secondary | ICD-10-CM | POA: Diagnosis not present

## 2017-07-15 DIAGNOSIS — S29012A Strain of muscle and tendon of back wall of thorax, initial encounter: Secondary | ICD-10-CM | POA: Diagnosis not present

## 2017-07-15 DIAGNOSIS — R030 Elevated blood-pressure reading, without diagnosis of hypertension: Secondary | ICD-10-CM | POA: Diagnosis not present

## 2017-07-17 ENCOUNTER — Other Ambulatory Visit: Payer: Self-pay | Admitting: Physician Assistant

## 2017-07-17 ENCOUNTER — Ambulatory Visit
Admission: RE | Admit: 2017-07-17 | Discharge: 2017-07-17 | Disposition: A | Discharge status: Home / Self Care | Payer: 59 | Source: Ambulatory Visit | Attending: Physician Assistant | Admitting: Physician Assistant

## 2017-07-17 DIAGNOSIS — R079 Chest pain, unspecified: Secondary | ICD-10-CM

## 2017-07-17 DIAGNOSIS — M546 Pain in thoracic spine: Secondary | ICD-10-CM | POA: Diagnosis not present

## 2017-07-22 DIAGNOSIS — M47892 Other spondylosis, cervical region: Secondary | ICD-10-CM | POA: Diagnosis not present

## 2017-07-22 DIAGNOSIS — M5020 Other cervical disc displacement, unspecified cervical region: Secondary | ICD-10-CM | POA: Diagnosis not present

## 2017-07-22 DIAGNOSIS — M4802 Spinal stenosis, cervical region: Secondary | ICD-10-CM | POA: Diagnosis not present

## 2017-07-22 DIAGNOSIS — M5030 Other cervical disc degeneration, unspecified cervical region: Secondary | ICD-10-CM | POA: Diagnosis not present

## 2017-07-24 DIAGNOSIS — M4722 Other spondylosis with radiculopathy, cervical region: Secondary | ICD-10-CM | POA: Diagnosis not present

## 2017-07-24 DIAGNOSIS — M5030 Other cervical disc degeneration, unspecified cervical region: Secondary | ICD-10-CM | POA: Diagnosis not present

## 2017-07-24 DIAGNOSIS — M791 Myalgia: Secondary | ICD-10-CM | POA: Diagnosis not present

## 2017-08-07 DIAGNOSIS — M791 Myalgia: Secondary | ICD-10-CM | POA: Diagnosis not present

## 2017-08-07 DIAGNOSIS — M4722 Other spondylosis with radiculopathy, cervical region: Secondary | ICD-10-CM | POA: Diagnosis not present

## 2017-08-07 DIAGNOSIS — M5030 Other cervical disc degeneration, unspecified cervical region: Secondary | ICD-10-CM | POA: Diagnosis not present

## 2017-08-17 DIAGNOSIS — M5020 Other cervical disc displacement, unspecified cervical region: Secondary | ICD-10-CM | POA: Diagnosis not present

## 2017-08-20 DIAGNOSIS — J3089 Other allergic rhinitis: Secondary | ICD-10-CM | POA: Diagnosis not present

## 2017-08-20 DIAGNOSIS — J301 Allergic rhinitis due to pollen: Secondary | ICD-10-CM | POA: Diagnosis not present

## 2017-08-20 DIAGNOSIS — J3081 Allergic rhinitis due to animal (cat) (dog) hair and dander: Secondary | ICD-10-CM | POA: Diagnosis not present

## 2017-08-24 DIAGNOSIS — M5020 Other cervical disc displacement, unspecified cervical region: Secondary | ICD-10-CM | POA: Diagnosis not present

## 2017-08-25 DIAGNOSIS — J301 Allergic rhinitis due to pollen: Secondary | ICD-10-CM | POA: Diagnosis not present

## 2017-08-25 DIAGNOSIS — J3089 Other allergic rhinitis: Secondary | ICD-10-CM | POA: Diagnosis not present

## 2017-08-25 DIAGNOSIS — J3081 Allergic rhinitis due to animal (cat) (dog) hair and dander: Secondary | ICD-10-CM | POA: Diagnosis not present

## 2017-08-26 DIAGNOSIS — J3089 Other allergic rhinitis: Secondary | ICD-10-CM | POA: Diagnosis not present

## 2017-08-27 DIAGNOSIS — J3089 Other allergic rhinitis: Secondary | ICD-10-CM | POA: Diagnosis not present

## 2017-08-27 DIAGNOSIS — J301 Allergic rhinitis due to pollen: Secondary | ICD-10-CM | POA: Diagnosis not present

## 2017-08-27 DIAGNOSIS — J3081 Allergic rhinitis due to animal (cat) (dog) hair and dander: Secondary | ICD-10-CM | POA: Diagnosis not present

## 2017-08-31 DIAGNOSIS — M5020 Other cervical disc displacement, unspecified cervical region: Secondary | ICD-10-CM | POA: Diagnosis not present

## 2017-09-04 DIAGNOSIS — J301 Allergic rhinitis due to pollen: Secondary | ICD-10-CM | POA: Diagnosis not present

## 2017-09-04 DIAGNOSIS — J3081 Allergic rhinitis due to animal (cat) (dog) hair and dander: Secondary | ICD-10-CM | POA: Diagnosis not present

## 2017-09-04 DIAGNOSIS — M4722 Other spondylosis with radiculopathy, cervical region: Secondary | ICD-10-CM | POA: Diagnosis not present

## 2017-09-04 DIAGNOSIS — M5030 Other cervical disc degeneration, unspecified cervical region: Secondary | ICD-10-CM | POA: Diagnosis not present

## 2017-09-04 DIAGNOSIS — M5020 Other cervical disc displacement, unspecified cervical region: Secondary | ICD-10-CM | POA: Diagnosis not present

## 2017-09-04 DIAGNOSIS — J3089 Other allergic rhinitis: Secondary | ICD-10-CM | POA: Diagnosis not present

## 2017-09-07 DIAGNOSIS — M25512 Pain in left shoulder: Secondary | ICD-10-CM | POA: Diagnosis not present

## 2017-09-07 DIAGNOSIS — R293 Abnormal posture: Secondary | ICD-10-CM | POA: Diagnosis not present

## 2017-09-07 DIAGNOSIS — M5023 Other cervical disc displacement, cervicothoracic region: Secondary | ICD-10-CM | POA: Diagnosis not present

## 2017-09-21 DIAGNOSIS — M5023 Other cervical disc displacement, cervicothoracic region: Secondary | ICD-10-CM | POA: Diagnosis not present

## 2017-09-21 DIAGNOSIS — J301 Allergic rhinitis due to pollen: Secondary | ICD-10-CM | POA: Diagnosis not present

## 2017-09-21 DIAGNOSIS — J3081 Allergic rhinitis due to animal (cat) (dog) hair and dander: Secondary | ICD-10-CM | POA: Diagnosis not present

## 2017-09-21 DIAGNOSIS — M25512 Pain in left shoulder: Secondary | ICD-10-CM | POA: Diagnosis not present

## 2017-09-21 DIAGNOSIS — J3089 Other allergic rhinitis: Secondary | ICD-10-CM | POA: Diagnosis not present

## 2017-09-21 DIAGNOSIS — R293 Abnormal posture: Secondary | ICD-10-CM | POA: Diagnosis not present

## 2017-09-28 DIAGNOSIS — J301 Allergic rhinitis due to pollen: Secondary | ICD-10-CM | POA: Diagnosis not present

## 2017-09-28 DIAGNOSIS — J3081 Allergic rhinitis due to animal (cat) (dog) hair and dander: Secondary | ICD-10-CM | POA: Diagnosis not present

## 2017-09-28 DIAGNOSIS — J3089 Other allergic rhinitis: Secondary | ICD-10-CM | POA: Diagnosis not present

## 2017-10-08 DIAGNOSIS — J301 Allergic rhinitis due to pollen: Secondary | ICD-10-CM | POA: Diagnosis not present

## 2017-10-08 DIAGNOSIS — J3089 Other allergic rhinitis: Secondary | ICD-10-CM | POA: Diagnosis not present

## 2017-10-08 DIAGNOSIS — J3081 Allergic rhinitis due to animal (cat) (dog) hair and dander: Secondary | ICD-10-CM | POA: Diagnosis not present

## 2017-10-13 DIAGNOSIS — M545 Low back pain: Secondary | ICD-10-CM | POA: Diagnosis not present

## 2017-10-19 DIAGNOSIS — M545 Low back pain: Secondary | ICD-10-CM | POA: Diagnosis not present

## 2017-10-20 DIAGNOSIS — J301 Allergic rhinitis due to pollen: Secondary | ICD-10-CM | POA: Diagnosis not present

## 2017-10-20 DIAGNOSIS — J3089 Other allergic rhinitis: Secondary | ICD-10-CM | POA: Diagnosis not present

## 2017-10-20 DIAGNOSIS — J3081 Allergic rhinitis due to animal (cat) (dog) hair and dander: Secondary | ICD-10-CM | POA: Diagnosis not present

## 2017-10-27 DIAGNOSIS — J3081 Allergic rhinitis due to animal (cat) (dog) hair and dander: Secondary | ICD-10-CM | POA: Diagnosis not present

## 2017-10-27 DIAGNOSIS — J3089 Other allergic rhinitis: Secondary | ICD-10-CM | POA: Diagnosis not present

## 2017-10-27 DIAGNOSIS — J301 Allergic rhinitis due to pollen: Secondary | ICD-10-CM | POA: Diagnosis not present

## 2017-10-28 DIAGNOSIS — M545 Low back pain: Secondary | ICD-10-CM | POA: Diagnosis not present

## 2017-11-03 DIAGNOSIS — J3081 Allergic rhinitis due to animal (cat) (dog) hair and dander: Secondary | ICD-10-CM | POA: Diagnosis not present

## 2017-11-03 DIAGNOSIS — J3089 Other allergic rhinitis: Secondary | ICD-10-CM | POA: Diagnosis not present

## 2017-11-03 DIAGNOSIS — J301 Allergic rhinitis due to pollen: Secondary | ICD-10-CM | POA: Diagnosis not present

## 2017-11-04 DIAGNOSIS — M545 Low back pain: Secondary | ICD-10-CM | POA: Diagnosis not present

## 2017-11-09 DIAGNOSIS — J301 Allergic rhinitis due to pollen: Secondary | ICD-10-CM | POA: Diagnosis not present

## 2017-11-09 DIAGNOSIS — J3089 Other allergic rhinitis: Secondary | ICD-10-CM | POA: Diagnosis not present

## 2017-11-09 DIAGNOSIS — J3081 Allergic rhinitis due to animal (cat) (dog) hair and dander: Secondary | ICD-10-CM | POA: Diagnosis not present

## 2017-11-12 DIAGNOSIS — J3089 Other allergic rhinitis: Secondary | ICD-10-CM | POA: Diagnosis not present

## 2017-11-12 DIAGNOSIS — J301 Allergic rhinitis due to pollen: Secondary | ICD-10-CM | POA: Diagnosis not present

## 2017-11-12 DIAGNOSIS — J3081 Allergic rhinitis due to animal (cat) (dog) hair and dander: Secondary | ICD-10-CM | POA: Diagnosis not present

## 2017-11-13 DIAGNOSIS — M5020 Other cervical disc displacement, unspecified cervical region: Secondary | ICD-10-CM | POA: Diagnosis not present

## 2017-11-13 DIAGNOSIS — M5030 Other cervical disc degeneration, unspecified cervical region: Secondary | ICD-10-CM | POA: Diagnosis not present

## 2017-11-13 DIAGNOSIS — M4722 Other spondylosis with radiculopathy, cervical region: Secondary | ICD-10-CM | POA: Diagnosis not present

## 2017-11-17 DIAGNOSIS — M545 Low back pain: Secondary | ICD-10-CM | POA: Diagnosis not present

## 2017-11-23 DIAGNOSIS — J3089 Other allergic rhinitis: Secondary | ICD-10-CM | POA: Diagnosis not present

## 2017-11-23 DIAGNOSIS — J301 Allergic rhinitis due to pollen: Secondary | ICD-10-CM | POA: Diagnosis not present

## 2017-11-23 DIAGNOSIS — J3081 Allergic rhinitis due to animal (cat) (dog) hair and dander: Secondary | ICD-10-CM | POA: Diagnosis not present

## 2017-12-09 DIAGNOSIS — M545 Low back pain: Secondary | ICD-10-CM | POA: Diagnosis not present

## 2017-12-16 DIAGNOSIS — M545 Low back pain: Secondary | ICD-10-CM | POA: Diagnosis not present

## 2017-12-22 DIAGNOSIS — J3081 Allergic rhinitis due to animal (cat) (dog) hair and dander: Secondary | ICD-10-CM | POA: Diagnosis not present

## 2017-12-22 DIAGNOSIS — J301 Allergic rhinitis due to pollen: Secondary | ICD-10-CM | POA: Diagnosis not present

## 2017-12-22 DIAGNOSIS — M545 Low back pain: Secondary | ICD-10-CM | POA: Diagnosis not present

## 2017-12-22 DIAGNOSIS — J3089 Other allergic rhinitis: Secondary | ICD-10-CM | POA: Diagnosis not present

## 2017-12-30 DIAGNOSIS — M545 Low back pain: Secondary | ICD-10-CM | POA: Diagnosis not present

## 2018-01-06 DIAGNOSIS — M545 Low back pain: Secondary | ICD-10-CM | POA: Diagnosis not present

## 2018-01-07 DIAGNOSIS — J3089 Other allergic rhinitis: Secondary | ICD-10-CM | POA: Diagnosis not present

## 2018-01-07 DIAGNOSIS — J301 Allergic rhinitis due to pollen: Secondary | ICD-10-CM | POA: Diagnosis not present

## 2018-01-07 DIAGNOSIS — J3081 Allergic rhinitis due to animal (cat) (dog) hair and dander: Secondary | ICD-10-CM | POA: Diagnosis not present

## 2018-01-12 DIAGNOSIS — J3089 Other allergic rhinitis: Secondary | ICD-10-CM | POA: Diagnosis not present

## 2018-01-12 DIAGNOSIS — J301 Allergic rhinitis due to pollen: Secondary | ICD-10-CM | POA: Diagnosis not present

## 2018-01-12 DIAGNOSIS — J3081 Allergic rhinitis due to animal (cat) (dog) hair and dander: Secondary | ICD-10-CM | POA: Diagnosis not present

## 2018-01-13 DIAGNOSIS — M545 Low back pain: Secondary | ICD-10-CM | POA: Diagnosis not present

## 2018-01-20 DIAGNOSIS — M545 Low back pain: Secondary | ICD-10-CM | POA: Diagnosis not present

## 2018-01-22 DIAGNOSIS — J3081 Allergic rhinitis due to animal (cat) (dog) hair and dander: Secondary | ICD-10-CM | POA: Diagnosis not present

## 2018-01-22 DIAGNOSIS — J301 Allergic rhinitis due to pollen: Secondary | ICD-10-CM | POA: Diagnosis not present

## 2018-01-22 DIAGNOSIS — J3089 Other allergic rhinitis: Secondary | ICD-10-CM | POA: Diagnosis not present

## 2018-01-27 DIAGNOSIS — M545 Low back pain: Secondary | ICD-10-CM | POA: Diagnosis not present

## 2018-02-01 DIAGNOSIS — J3089 Other allergic rhinitis: Secondary | ICD-10-CM | POA: Diagnosis not present

## 2018-02-01 DIAGNOSIS — J3081 Allergic rhinitis due to animal (cat) (dog) hair and dander: Secondary | ICD-10-CM | POA: Diagnosis not present

## 2018-02-01 DIAGNOSIS — J301 Allergic rhinitis due to pollen: Secondary | ICD-10-CM | POA: Diagnosis not present

## 2018-02-05 DIAGNOSIS — M545 Low back pain: Secondary | ICD-10-CM | POA: Diagnosis not present

## 2018-02-10 DIAGNOSIS — J3089 Other allergic rhinitis: Secondary | ICD-10-CM | POA: Diagnosis not present

## 2018-02-10 DIAGNOSIS — J301 Allergic rhinitis due to pollen: Secondary | ICD-10-CM | POA: Diagnosis not present

## 2018-02-10 DIAGNOSIS — J3081 Allergic rhinitis due to animal (cat) (dog) hair and dander: Secondary | ICD-10-CM | POA: Diagnosis not present

## 2018-02-23 DIAGNOSIS — J3089 Other allergic rhinitis: Secondary | ICD-10-CM | POA: Diagnosis not present

## 2018-02-23 DIAGNOSIS — J301 Allergic rhinitis due to pollen: Secondary | ICD-10-CM | POA: Diagnosis not present

## 2018-02-23 DIAGNOSIS — J3081 Allergic rhinitis due to animal (cat) (dog) hair and dander: Secondary | ICD-10-CM | POA: Diagnosis not present

## 2018-02-24 DIAGNOSIS — M545 Low back pain: Secondary | ICD-10-CM | POA: Diagnosis not present

## 2018-03-03 DIAGNOSIS — M545 Low back pain: Secondary | ICD-10-CM | POA: Diagnosis not present

## 2018-03-05 DIAGNOSIS — J3081 Allergic rhinitis due to animal (cat) (dog) hair and dander: Secondary | ICD-10-CM | POA: Diagnosis not present

## 2018-03-05 DIAGNOSIS — J301 Allergic rhinitis due to pollen: Secondary | ICD-10-CM | POA: Diagnosis not present

## 2018-03-05 DIAGNOSIS — J3089 Other allergic rhinitis: Secondary | ICD-10-CM | POA: Diagnosis not present

## 2018-03-10 DIAGNOSIS — M545 Low back pain: Secondary | ICD-10-CM | POA: Diagnosis not present

## 2018-03-12 DIAGNOSIS — M545 Low back pain: Secondary | ICD-10-CM | POA: Diagnosis not present

## 2018-03-17 DIAGNOSIS — M545 Low back pain: Secondary | ICD-10-CM | POA: Diagnosis not present

## 2018-03-23 DIAGNOSIS — M545 Low back pain: Secondary | ICD-10-CM | POA: Diagnosis not present

## 2018-03-24 DIAGNOSIS — J3081 Allergic rhinitis due to animal (cat) (dog) hair and dander: Secondary | ICD-10-CM | POA: Diagnosis not present

## 2018-03-24 DIAGNOSIS — J301 Allergic rhinitis due to pollen: Secondary | ICD-10-CM | POA: Diagnosis not present

## 2018-03-24 DIAGNOSIS — J3089 Other allergic rhinitis: Secondary | ICD-10-CM | POA: Diagnosis not present

## 2018-03-31 DIAGNOSIS — J3089 Other allergic rhinitis: Secondary | ICD-10-CM | POA: Diagnosis not present

## 2018-03-31 DIAGNOSIS — J301 Allergic rhinitis due to pollen: Secondary | ICD-10-CM | POA: Diagnosis not present

## 2018-03-31 DIAGNOSIS — J3081 Allergic rhinitis due to animal (cat) (dog) hair and dander: Secondary | ICD-10-CM | POA: Diagnosis not present

## 2018-04-08 DIAGNOSIS — J3089 Other allergic rhinitis: Secondary | ICD-10-CM | POA: Diagnosis not present

## 2018-04-08 DIAGNOSIS — J3081 Allergic rhinitis due to animal (cat) (dog) hair and dander: Secondary | ICD-10-CM | POA: Diagnosis not present

## 2018-04-08 DIAGNOSIS — J301 Allergic rhinitis due to pollen: Secondary | ICD-10-CM | POA: Diagnosis not present

## 2018-04-15 DIAGNOSIS — J301 Allergic rhinitis due to pollen: Secondary | ICD-10-CM | POA: Diagnosis not present

## 2018-04-15 DIAGNOSIS — J3089 Other allergic rhinitis: Secondary | ICD-10-CM | POA: Diagnosis not present

## 2018-04-15 DIAGNOSIS — J3081 Allergic rhinitis due to animal (cat) (dog) hair and dander: Secondary | ICD-10-CM | POA: Diagnosis not present

## 2018-04-21 DIAGNOSIS — J3081 Allergic rhinitis due to animal (cat) (dog) hair and dander: Secondary | ICD-10-CM | POA: Diagnosis not present

## 2018-04-21 DIAGNOSIS — J301 Allergic rhinitis due to pollen: Secondary | ICD-10-CM | POA: Diagnosis not present

## 2018-04-21 DIAGNOSIS — M545 Low back pain: Secondary | ICD-10-CM | POA: Diagnosis not present

## 2018-04-22 DIAGNOSIS — J3089 Other allergic rhinitis: Secondary | ICD-10-CM | POA: Diagnosis not present

## 2018-04-27 DIAGNOSIS — J301 Allergic rhinitis due to pollen: Secondary | ICD-10-CM | POA: Diagnosis not present

## 2018-04-27 DIAGNOSIS — J3089 Other allergic rhinitis: Secondary | ICD-10-CM | POA: Diagnosis not present

## 2018-04-27 DIAGNOSIS — J3081 Allergic rhinitis due to animal (cat) (dog) hair and dander: Secondary | ICD-10-CM | POA: Diagnosis not present

## 2018-05-06 DIAGNOSIS — M545 Low back pain: Secondary | ICD-10-CM | POA: Diagnosis not present

## 2018-05-07 DIAGNOSIS — J3081 Allergic rhinitis due to animal (cat) (dog) hair and dander: Secondary | ICD-10-CM | POA: Diagnosis not present

## 2018-05-07 DIAGNOSIS — J301 Allergic rhinitis due to pollen: Secondary | ICD-10-CM | POA: Diagnosis not present

## 2018-05-07 DIAGNOSIS — J3089 Other allergic rhinitis: Secondary | ICD-10-CM | POA: Diagnosis not present

## 2018-05-17 DIAGNOSIS — J301 Allergic rhinitis due to pollen: Secondary | ICD-10-CM | POA: Diagnosis not present

## 2018-05-17 DIAGNOSIS — J3081 Allergic rhinitis due to animal (cat) (dog) hair and dander: Secondary | ICD-10-CM | POA: Diagnosis not present

## 2018-05-17 DIAGNOSIS — J3089 Other allergic rhinitis: Secondary | ICD-10-CM | POA: Diagnosis not present

## 2018-05-20 DIAGNOSIS — M545 Low back pain: Secondary | ICD-10-CM | POA: Diagnosis not present

## 2018-05-25 DIAGNOSIS — J301 Allergic rhinitis due to pollen: Secondary | ICD-10-CM | POA: Diagnosis not present

## 2018-05-25 DIAGNOSIS — J3081 Allergic rhinitis due to animal (cat) (dog) hair and dander: Secondary | ICD-10-CM | POA: Diagnosis not present

## 2018-05-25 DIAGNOSIS — J3089 Other allergic rhinitis: Secondary | ICD-10-CM | POA: Diagnosis not present

## 2018-06-02 DIAGNOSIS — M545 Low back pain: Secondary | ICD-10-CM | POA: Diagnosis not present

## 2018-06-16 DIAGNOSIS — M545 Low back pain: Secondary | ICD-10-CM | POA: Diagnosis not present

## 2018-06-23 DIAGNOSIS — J3081 Allergic rhinitis due to animal (cat) (dog) hair and dander: Secondary | ICD-10-CM | POA: Diagnosis not present

## 2018-06-23 DIAGNOSIS — J301 Allergic rhinitis due to pollen: Secondary | ICD-10-CM | POA: Diagnosis not present

## 2018-06-23 DIAGNOSIS — J3089 Other allergic rhinitis: Secondary | ICD-10-CM | POA: Diagnosis not present

## 2018-07-06 DIAGNOSIS — J301 Allergic rhinitis due to pollen: Secondary | ICD-10-CM | POA: Diagnosis not present

## 2018-07-06 DIAGNOSIS — J3081 Allergic rhinitis due to animal (cat) (dog) hair and dander: Secondary | ICD-10-CM | POA: Diagnosis not present

## 2018-07-06 DIAGNOSIS — J3089 Other allergic rhinitis: Secondary | ICD-10-CM | POA: Diagnosis not present

## 2018-07-08 DIAGNOSIS — M545 Low back pain: Secondary | ICD-10-CM | POA: Diagnosis not present

## 2018-07-15 DIAGNOSIS — J3089 Other allergic rhinitis: Secondary | ICD-10-CM | POA: Diagnosis not present

## 2018-07-15 DIAGNOSIS — J3081 Allergic rhinitis due to animal (cat) (dog) hair and dander: Secondary | ICD-10-CM | POA: Diagnosis not present

## 2018-07-15 DIAGNOSIS — J301 Allergic rhinitis due to pollen: Secondary | ICD-10-CM | POA: Diagnosis not present

## 2018-07-19 DIAGNOSIS — M545 Low back pain: Secondary | ICD-10-CM | POA: Diagnosis not present

## 2018-07-21 DIAGNOSIS — J301 Allergic rhinitis due to pollen: Secondary | ICD-10-CM | POA: Diagnosis not present

## 2018-07-21 DIAGNOSIS — J3089 Other allergic rhinitis: Secondary | ICD-10-CM | POA: Diagnosis not present

## 2018-07-21 DIAGNOSIS — J3081 Allergic rhinitis due to animal (cat) (dog) hair and dander: Secondary | ICD-10-CM | POA: Diagnosis not present

## 2018-07-27 DIAGNOSIS — J3089 Other allergic rhinitis: Secondary | ICD-10-CM | POA: Diagnosis not present

## 2018-07-27 DIAGNOSIS — J3081 Allergic rhinitis due to animal (cat) (dog) hair and dander: Secondary | ICD-10-CM | POA: Diagnosis not present

## 2018-07-27 DIAGNOSIS — J301 Allergic rhinitis due to pollen: Secondary | ICD-10-CM | POA: Diagnosis not present

## 2018-08-02 DIAGNOSIS — J3081 Allergic rhinitis due to animal (cat) (dog) hair and dander: Secondary | ICD-10-CM | POA: Diagnosis not present

## 2018-08-02 DIAGNOSIS — J301 Allergic rhinitis due to pollen: Secondary | ICD-10-CM | POA: Diagnosis not present

## 2018-08-02 DIAGNOSIS — J3089 Other allergic rhinitis: Secondary | ICD-10-CM | POA: Diagnosis not present

## 2018-08-05 DIAGNOSIS — M545 Low back pain: Secondary | ICD-10-CM | POA: Diagnosis not present

## 2018-08-10 DIAGNOSIS — J3081 Allergic rhinitis due to animal (cat) (dog) hair and dander: Secondary | ICD-10-CM | POA: Diagnosis not present

## 2018-08-10 DIAGNOSIS — J3089 Other allergic rhinitis: Secondary | ICD-10-CM | POA: Diagnosis not present

## 2018-08-10 DIAGNOSIS — J301 Allergic rhinitis due to pollen: Secondary | ICD-10-CM | POA: Diagnosis not present

## 2018-08-24 DIAGNOSIS — J301 Allergic rhinitis due to pollen: Secondary | ICD-10-CM | POA: Diagnosis not present

## 2018-08-24 DIAGNOSIS — J3081 Allergic rhinitis due to animal (cat) (dog) hair and dander: Secondary | ICD-10-CM | POA: Diagnosis not present

## 2018-08-24 DIAGNOSIS — J3089 Other allergic rhinitis: Secondary | ICD-10-CM | POA: Diagnosis not present

## 2018-09-01 DIAGNOSIS — J3081 Allergic rhinitis due to animal (cat) (dog) hair and dander: Secondary | ICD-10-CM | POA: Diagnosis not present

## 2018-09-01 DIAGNOSIS — J3089 Other allergic rhinitis: Secondary | ICD-10-CM | POA: Diagnosis not present

## 2018-09-01 DIAGNOSIS — J301 Allergic rhinitis due to pollen: Secondary | ICD-10-CM | POA: Diagnosis not present

## 2018-09-13 DIAGNOSIS — J3081 Allergic rhinitis due to animal (cat) (dog) hair and dander: Secondary | ICD-10-CM | POA: Diagnosis not present

## 2018-09-13 DIAGNOSIS — J301 Allergic rhinitis due to pollen: Secondary | ICD-10-CM | POA: Diagnosis not present

## 2018-09-13 DIAGNOSIS — J3089 Other allergic rhinitis: Secondary | ICD-10-CM | POA: Diagnosis not present

## 2018-09-27 DIAGNOSIS — J301 Allergic rhinitis due to pollen: Secondary | ICD-10-CM | POA: Diagnosis not present

## 2018-09-27 DIAGNOSIS — J3089 Other allergic rhinitis: Secondary | ICD-10-CM | POA: Diagnosis not present

## 2018-09-27 DIAGNOSIS — J3081 Allergic rhinitis due to animal (cat) (dog) hair and dander: Secondary | ICD-10-CM | POA: Diagnosis not present

## 2018-10-05 DIAGNOSIS — M5412 Radiculopathy, cervical region: Secondary | ICD-10-CM | POA: Diagnosis not present

## 2018-10-05 DIAGNOSIS — M542 Cervicalgia: Secondary | ICD-10-CM | POA: Diagnosis not present

## 2018-10-13 ENCOUNTER — Other Ambulatory Visit: Payer: Self-pay | Admitting: Orthopaedic Surgery

## 2018-10-13 DIAGNOSIS — M542 Cervicalgia: Secondary | ICD-10-CM

## 2018-10-19 DIAGNOSIS — J3081 Allergic rhinitis due to animal (cat) (dog) hair and dander: Secondary | ICD-10-CM | POA: Diagnosis not present

## 2018-10-19 DIAGNOSIS — J301 Allergic rhinitis due to pollen: Secondary | ICD-10-CM | POA: Diagnosis not present

## 2018-10-19 DIAGNOSIS — J3089 Other allergic rhinitis: Secondary | ICD-10-CM | POA: Diagnosis not present

## 2018-10-23 DIAGNOSIS — J019 Acute sinusitis, unspecified: Secondary | ICD-10-CM | POA: Diagnosis not present

## 2018-11-09 DIAGNOSIS — J3089 Other allergic rhinitis: Secondary | ICD-10-CM | POA: Diagnosis not present

## 2018-11-09 DIAGNOSIS — J301 Allergic rhinitis due to pollen: Secondary | ICD-10-CM | POA: Diagnosis not present

## 2018-12-13 ENCOUNTER — Ambulatory Visit
Admission: RE | Admit: 2018-12-13 | Discharge: 2018-12-13 | Disposition: A | Discharge status: Home / Self Care | Payer: 59 | Source: Ambulatory Visit | Attending: Orthopaedic Surgery | Admitting: Orthopaedic Surgery

## 2018-12-13 DIAGNOSIS — M542 Cervicalgia: Secondary | ICD-10-CM

## 2018-12-13 DIAGNOSIS — M4802 Spinal stenosis, cervical region: Secondary | ICD-10-CM | POA: Diagnosis not present

## 2018-12-14 DIAGNOSIS — M542 Cervicalgia: Secondary | ICD-10-CM | POA: Diagnosis not present

## 2018-12-14 DIAGNOSIS — M5412 Radiculopathy, cervical region: Secondary | ICD-10-CM | POA: Diagnosis not present

## 2018-12-14 DIAGNOSIS — M50122 Cervical disc disorder at C5-C6 level with radiculopathy: Secondary | ICD-10-CM | POA: Diagnosis not present

## 2018-12-15 DIAGNOSIS — J3089 Other allergic rhinitis: Secondary | ICD-10-CM | POA: Diagnosis not present

## 2018-12-15 DIAGNOSIS — J301 Allergic rhinitis due to pollen: Secondary | ICD-10-CM | POA: Diagnosis not present

## 2018-12-15 DIAGNOSIS — J3081 Allergic rhinitis due to animal (cat) (dog) hair and dander: Secondary | ICD-10-CM | POA: Diagnosis not present

## 2018-12-24 DIAGNOSIS — M50122 Cervical disc disorder at C5-C6 level with radiculopathy: Secondary | ICD-10-CM | POA: Diagnosis not present

## 2018-12-24 DIAGNOSIS — M5412 Radiculopathy, cervical region: Secondary | ICD-10-CM | POA: Diagnosis not present

## 2018-12-24 DIAGNOSIS — Z6828 Body mass index (BMI) 28.0-28.9, adult: Secondary | ICD-10-CM | POA: Diagnosis not present

## 2019-01-03 DIAGNOSIS — M5412 Radiculopathy, cervical region: Secondary | ICD-10-CM | POA: Diagnosis not present

## 2019-01-13 DIAGNOSIS — M50122 Cervical disc disorder at C5-C6 level with radiculopathy: Secondary | ICD-10-CM | POA: Diagnosis not present

## 2019-01-13 DIAGNOSIS — Z7409 Other reduced mobility: Secondary | ICD-10-CM | POA: Diagnosis not present

## 2019-01-14 DIAGNOSIS — J3081 Allergic rhinitis due to animal (cat) (dog) hair and dander: Secondary | ICD-10-CM | POA: Diagnosis not present

## 2019-01-14 DIAGNOSIS — J3089 Other allergic rhinitis: Secondary | ICD-10-CM | POA: Diagnosis not present

## 2019-01-14 DIAGNOSIS — J301 Allergic rhinitis due to pollen: Secondary | ICD-10-CM | POA: Diagnosis not present

## 2019-01-17 DIAGNOSIS — M4722 Other spondylosis with radiculopathy, cervical region: Secondary | ICD-10-CM | POA: Diagnosis not present

## 2019-01-17 DIAGNOSIS — M542 Cervicalgia: Secondary | ICD-10-CM | POA: Diagnosis not present

## 2019-01-17 DIAGNOSIS — M5412 Radiculopathy, cervical region: Secondary | ICD-10-CM | POA: Diagnosis not present

## 2019-01-26 DIAGNOSIS — R42 Dizziness and giddiness: Secondary | ICD-10-CM | POA: Diagnosis not present

## 2019-01-26 DIAGNOSIS — M5412 Radiculopathy, cervical region: Secondary | ICD-10-CM | POA: Diagnosis not present

## 2019-01-26 DIAGNOSIS — R030 Elevated blood-pressure reading, without diagnosis of hypertension: Secondary | ICD-10-CM | POA: Diagnosis not present

## 2019-02-01 DIAGNOSIS — J3089 Other allergic rhinitis: Secondary | ICD-10-CM | POA: Diagnosis not present

## 2019-02-01 DIAGNOSIS — J3081 Allergic rhinitis due to animal (cat) (dog) hair and dander: Secondary | ICD-10-CM | POA: Diagnosis not present

## 2019-02-01 DIAGNOSIS — J301 Allergic rhinitis due to pollen: Secondary | ICD-10-CM | POA: Diagnosis not present

## 2019-02-10 DIAGNOSIS — J3081 Allergic rhinitis due to animal (cat) (dog) hair and dander: Secondary | ICD-10-CM | POA: Diagnosis not present

## 2019-02-10 DIAGNOSIS — J3089 Other allergic rhinitis: Secondary | ICD-10-CM | POA: Diagnosis not present

## 2019-02-10 DIAGNOSIS — J301 Allergic rhinitis due to pollen: Secondary | ICD-10-CM | POA: Diagnosis not present

## 2019-03-15 DIAGNOSIS — M4722 Other spondylosis with radiculopathy, cervical region: Secondary | ICD-10-CM | POA: Diagnosis not present

## 2019-03-15 DIAGNOSIS — M5412 Radiculopathy, cervical region: Secondary | ICD-10-CM | POA: Diagnosis not present

## 2019-03-15 DIAGNOSIS — M542 Cervicalgia: Secondary | ICD-10-CM | POA: Diagnosis not present

## 2020-01-17 ENCOUNTER — Ambulatory Visit: Payer: 59 | Attending: Internal Medicine

## 2020-01-17 DIAGNOSIS — Z20822 Contact with and (suspected) exposure to covid-19: Secondary | ICD-10-CM

## 2020-01-18 LAB — NOVEL CORONAVIRUS, NAA: SARS-CoV-2, NAA: DETECTED — AB

## 2020-01-19 ENCOUNTER — Telehealth: Payer: Self-pay | Admitting: Nurse Practitioner

## 2020-01-19 NOTE — Telephone Encounter (Signed)
Called to discuss with Samule Dry about Covid symptoms and the use of bamlanivimab, a monoclonal antibody infusion for those with mild to moderate Covid symptoms and at a high risk of hospitalization.      On chart review Pt is not qualified for this infusion due to lack of identified risk factors and co-morbid conditions. Would require further discussions to assess qualification.   Unable to reach patient. Message left.   Willette Alma, AGPCNP-BC Pager: [phone] Amion: Thea Alken

## 2021-02-08 ENCOUNTER — Encounter: Payer: Self-pay | Admitting: Family Medicine

## 2021-02-08 ENCOUNTER — Telehealth (INDEPENDENT_AMBULATORY_CARE_PROVIDER_SITE_OTHER): Payer: 59 | Admitting: Family Medicine

## 2021-02-08 ENCOUNTER — Other Ambulatory Visit: Payer: Self-pay

## 2021-02-08 ENCOUNTER — Telehealth: Payer: Self-pay

## 2021-02-08 VITALS — HR 92 | Ht 70.0 in

## 2021-02-08 DIAGNOSIS — U071 COVID-19: Secondary | ICD-10-CM

## 2021-02-08 MED ORDER — BUDESONIDE 90 MCG/ACT IN AEPB: 2.0000 | INHALATION_SPRAY | Freq: Two times a day (BID) | RESPIRATORY_TRACT | 0 refills | Status: AC

## 2021-02-08 NOTE — Patient Instructions (Signed)
Nice to see you! Here are my dosages for covid vitamins.   1) vitamin D3: 5000IU/day 2) vitamin C: 2000mg /day 3) zinc: 100mg /day 4) quercetin 250mg  BID 5) melatonin 10mg .   I have people take thisese for 5-10 days then stop or go to a much lower dosages.   Make sure getting outside and deep breathing. Start pulmicort if feel tight in chest. But you sound good on exam today.   So nice to meet you! Dr. 

## 2021-02-08 NOTE — Progress Notes (Signed)
Patient: Patrick Cabrera MRN: [id number] DOB: 16-Jun-1976 PCP: Clovis Riley, L.August Saucer, MD     I connected with Samule Dry on 02/08/21 at 1:28pm by a video enabled telemedicine application and verified that I am speaking with the correct person using two identifiers.  Location patient: Home Location provider: [facility], Office Persons participating in this virtual visit: Marlo Goodrich and Dr. Artis Flock   I discussed the limitations of evaluation and management by telemedicine and the availability of in person appointments. The patient expressed understanding and agreed to proceed.   Subjective:  Chief Complaint  Patient presents with  . Covid Positive    Symptoms started late Monday night.  . Chest Pain    Starting last night. He says that when he takes a deep breath, he feels achiness.     HPI: The patient is a 45 y.o. male who presents today for positive Covid result.  He started to feel bad on Monday and tested positive on Wednesday. He had fever and congestion for a few days, but has been fever free since Wednesday. He states his only concern is he has pain or tightness across his chest with a big deep breath at the end of inhalation. He has a pulse ox and oxygenation has stayed around 97%. He has a cough, but it's mild and from the drainage. He had covid a year ago. He has been up and doing things. Doesn't really feel achy. He is not vaccinated. Quite healthy. Does have allergies that he gets allergy shots for. Has been on prophylactic vitamins since having covid. Has only taken nyquil and ibuprofen. Denies any shortness of breath, leg swelling, dyspnea.   Review of Systems  Constitutional: Positive for fatigue. Negative for chills and fever.  HENT: Positive for congestion, postnasal drip and sore throat.   Respiratory: Positive for chest tightness. Negative for shortness of breath.   Cardiovascular: Negative for chest pain, palpitations and leg swelling.  Neurological: Negative for dizziness  and headaches.    Allergies Patient has No Known Allergies.  Past Medical History Patient  has a past medical history of History of gastric ulcer, Seasonal allergies, and Umbilical hernia (02/2015).  Surgical History Patient  has a past surgical history that includes Upper gi endoscopy and Umbilical hernia repair (N/A, 02/16/2015).  Family History Pateint's family history is not on file.  Social History Patient  reports that he has never smoked. He has never used smokeless tobacco. He reports that he does not drink alcohol and does not use drugs.    Objective: Vitals:   02/08/21 1254  Pulse: 92  SpO2: 97%  Height: 5\' 10"  (1.778 m)    Body mass index is 29.7 kg/m.  Physical Exam Vitals reviewed.  Constitutional:      General: He is not in acute distress.    Appearance: He is well-developed and normal weight. He is not ill-appearing.  HENT:     Head: Normocephalic and atraumatic.  Cardiovascular:     Rate and Rhythm: Normal rate and regular rhythm.     Heart sounds: Normal heart sounds. No murmur heard.   Pulmonary:     Effort: Pulmonary effort is normal. No respiratory distress.     Breath sounds: Normal breath sounds. No decreased breath sounds, wheezing, rhonchi or rales.  Musculoskeletal:     Cervical back: Normal range of motion and neck supple.  Skin:    General: Skin is warm.     Capillary Refill: Capillary refill takes less than 2 seconds.  Neurological:     General: No focal deficit present.     Mental Status: He is alert and oriented to person, place, and time.  Psychiatric:        Mood and Affect: Mood normal.        Behavior: Behavior normal.        Assessment/plan: 1. COVID-19 -clinically looks and sounds good. He is low risk. Has had past covid infection. Does not want any treatment and feels good, just wanted his lungs listened to and would like to know precautions.  -wrote down prophylactic covid vitamins -will send in pulmicort to have if  chest gets tight -ER precautions given and discussed with him in detail.  -he again doesn't feel like he needs any other treatment. Clinically he is mild and does look good, but precautions given.     Return if symptoms worsen or fail to improve.     Orland Mustard, MD [facility]  02/08/2021

## 2021-02-08 NOTE — Telephone Encounter (Signed)
Pt is scheduled for a car visit at 1 pm

## 2021-02-08 NOTE — Telephone Encounter (Signed)
Pt's wife, Amy, is a pt of Dr. Artis Flock. She is wanting to know if Dr. Artis Flock could see her husband. He is covid positive.

## 2021-02-08 NOTE — Telephone Encounter (Signed)
That's fine. Put him in at 1:00.  Thanks! Aw

## 2021-03-13 ENCOUNTER — Other Ambulatory Visit: Payer: Self-pay | Admitting: Family Medicine

## 2021-06-08 ENCOUNTER — Emergency Department (HOSPITAL_BASED_OUTPATIENT_CLINIC_OR_DEPARTMENT_OTHER)
Admission: EM | Admit: 2021-06-08 | Discharge: 2021-06-08 | Disposition: A | Discharge status: Home / Self Care | Payer: 59 | Attending: Emergency Medicine | Admitting: Emergency Medicine

## 2021-06-08 ENCOUNTER — Other Ambulatory Visit: Payer: Self-pay

## 2021-06-08 ENCOUNTER — Emergency Department (HOSPITAL_BASED_OUTPATIENT_CLINIC_OR_DEPARTMENT_OTHER): Payer: 59

## 2021-06-08 ENCOUNTER — Telehealth (HOSPITAL_BASED_OUTPATIENT_CLINIC_OR_DEPARTMENT_OTHER): Payer: Self-pay | Admitting: Emergency Medicine

## 2021-06-08 DIAGNOSIS — S8992XA Unspecified injury of left lower leg, initial encounter: Secondary | ICD-10-CM | POA: Diagnosis present

## 2021-06-08 DIAGNOSIS — S86912A Strain of unspecified muscle(s) and tendon(s) at lower leg level, left leg, initial encounter: Secondary | ICD-10-CM | POA: Diagnosis not present

## 2021-06-08 DIAGNOSIS — X58XXXA Exposure to other specified factors, initial encounter: Secondary | ICD-10-CM | POA: Insufficient documentation

## 2021-06-08 DIAGNOSIS — T148XXA Other injury of unspecified body region, initial encounter: Secondary | ICD-10-CM

## 2021-06-08 DIAGNOSIS — Y9389 Activity, other specified: Secondary | ICD-10-CM | POA: Insufficient documentation

## 2021-06-08 DIAGNOSIS — M79605 Pain in left leg: Secondary | ICD-10-CM | POA: Insufficient documentation

## 2021-06-08 DIAGNOSIS — Y92007 Garden or yard of unspecified non-institutional (private) residence as the place of occurrence of the external cause: Secondary | ICD-10-CM | POA: Insufficient documentation

## 2021-06-08 MED ORDER — DICLOFENAC SODIUM 1 % EX GEL: 2.0000 g | Freq: Four times a day (QID) | CUTANEOUS | 0 refills | Status: AC

## 2021-06-08 NOTE — ED Provider Notes (Signed)
[facility] EMERGENCY DEPARTMENT Provider Note   CSN: [id number] Arrival date & time: 06/08/21  1612     History Chief Complaint  Patient presents with   Leg Pain    Patrick Cabrera is a 45 y.o. male presenting to the ED with a chief complaint of left posterior knee pain.  Today started experiencing pain and pulling sensation behind left knee more medially.  Spent time doing yard work yesterday and is unsure if this caused the pain.  No injury or trauma.  Does report two plane rides this week and frequent sitting at meetings for work over the past week.  No history of DVT in the past.  Minimal improvement noted with 1 dose of naproxen and icing it today.  States that pain was worse when he tried to ambulate to his backyard.  Denies any fever, history of gout, joint swelling, numbness.  HPI     Past Medical History:  Diagnosis Date   History of gastric ulcer    resolved   Seasonal allergies    weekly allergy shots   Umbilical hernia 02/2015    There are no problems to display for this patient.   Past Surgical History:  Procedure Laterality Date   UMBILICAL HERNIA REPAIR N/A 02/16/2015   Procedure: OPEN REPAIR UMBILICAL HERNIA ;  Surgeon: Gaynelle Adu, MD;  Location: [facility];  Service: General;  Laterality: N/A;   UPPER GI ENDOSCOPY         No family history on file.  Social History   Tobacco Use   Smoking status: Never   Smokeless tobacco: Never  Substance Use Topics   Alcohol use: No   Drug use: No    Home Medications Prior to Admission medications   Medication Sig Start Date End Date Taking? Authorizing Provider  diclofenac Sodium (VOLTAREN) 1 % GEL Apply 2 g topically 4 (four) times daily. 06/08/21  Yes Azelea Seguin, PA-C  acetaminophen (TYLENOL) 500 MG tablet Take 500 mg by mouth every 6 (six) hours as needed.    [provider]  azelastine (ASTELIN) 0.1 % nasal spray Place into both nostrils 2 (two) times daily. Use in each  nostril as directed Patient not taking: Reported on 02/08/2021    [provider]  Budesonide 90 MCG/ACT inhaler Inhale 2 puffs into the lungs 2 (two) times daily. 02/08/21   Orland Mustard, MD  fluticasone (FLONASE) 50 MCG/ACT nasal spray Place into both nostrils daily. Patient not taking: Reported on 02/08/2021    [provider]  oxyCODONE (OXY IR/ROXICODONE) 5 MG immediate release tablet Take 1-2 tablets (5-10 mg total) by mouth every 4 (four) hours as needed for moderate pain, severe pain or breakthrough pain. Patient not taking: Reported on 02/08/2021 02/16/15   Gaynelle Adu, MD    Allergies    Patient has no known allergies.  Review of Systems   Review of Systems  Constitutional:  Negative for chills and fever.  Musculoskeletal:  Positive for myalgias.  Neurological:  Negative for weakness and numbness.   Physical Exam Updated Vital Signs BP (!) 144/92 (BP Location: Right Arm)   Pulse 75   Temp 98.7 F (37.1 C) (Oral)   Resp 18   Ht 5\' 10"  (1.778 m)   Wt 93 kg   SpO2 98%   BMI 29.41 kg/m   Physical Exam Vitals and nursing note reviewed.  Constitutional:      General: He is not in acute distress.    Appearance:  He is well-developed. He is not diaphoretic.  HENT:     Head: Normocephalic and atraumatic.  Eyes:     General: No scleral icterus.    Conjunctiva/sclera: Conjunctivae normal.  Pulmonary:     Effort: Pulmonary effort is normal. No respiratory distress.  Musculoskeletal:        General: Tenderness present.     Cervical back: Normal range of motion.     Comments: Specific area of tenderness to palpation of the last posterior medial knee.  Normal range of motion of bilateral knees.  2+ DP pulse palpated.  Normal sensation to light touch of bilateral lower extremities.  No calf tenderness bilaterally.  No erythema, edema or warmth of joint.  Skin:    Findings: No rash.  Neurological:     Mental Status: He is alert.    ED Results / Procedures /  Treatments   Labs (all labs ordered are listed, but only abnormal results are displayed) Labs Reviewed - No data to display  EKG None  Radiology US Venous Img Lower Unilateral Left  Result Date: 06/08/2021 CLINICAL DATA:  Posterior knee pain. EXAM: LEFT LOWER EXTREMITY VENOUS DOPPLER ULTRASOUND TECHNIQUE: Gray-scale sonography with compression, as well as color and duplex ultrasound, were performed to evaluate the deep venous system(s) from the level of the common femoral vein through the popliteal and proximal calf veins. COMPARISON:  None. FINDINGS: VENOUS Normal compressibility of the common femoral, superficial femoral, and popliteal veins, as well as the visualized calf veins. Visualized portions of profunda femoral vein and great saphenous vein unremarkable. No filling defects to suggest DVT on grayscale or color Doppler imaging. Doppler waveforms show normal direction of venous flow, normal respiratory plasticity and response to augmentation. Limited views of the contralateral common femoral vein are unremarkable. OTHER No Baker's cyst demonstrated by the technologist. Limitations: none IMPRESSION: No evidence of left lower extremity DVT. Electronically Signed   By: Narda Rutherford M.D.   On: 06/08/2021 17:29   DG Knee Complete 4 Views Left  Result Date: 06/08/2021 CLINICAL DATA:  Left knee pain. EXAM: LEFT KNEE - COMPLETE 4+ VIEW COMPARISON:  None. FINDINGS: No evidence of fracture, dislocation, or joint effusion. Normal alignment and joint spaces. No evidence of arthropathy or other focal bone abnormality. Soft tissues are unremarkable. IMPRESSION: Negative radiographs of the left knee. Electronically Signed   By: Narda Rutherford M.D.   On: 06/08/2021 17:31    Procedures Procedures   Medications Ordered in ED Medications - No data to display  ED Course  I have reviewed the triage vital signs and the nursing notes.  Pertinent labs & imaging results that were available during my care  of the patient were reviewed by me and considered in my medical decision making (see chart for details).    MDM Rules/Calculators/A&P                          45 year old male presenting to the ED for left posterior medial knee pain that began today.  Did not improve with 1 dose of naproxen and icing the area.  History of frequent travel this week as well as more sedentary lifestyle with going to meetings for work.  On exam there is pinpoint tenderness of a possible ligament in the posterior left knee.  No overlying skin changes.  No erythema, edema or warmth of joint.  Normal range of motion of knee.  Areas neurovascularly intact.  Will check x-ray and Doppler  study to rule out DVT.  X-ray of the knee is negative for acute abnormality.  Vascular studies negative for DVT.  I suspect that symptoms could be musculoskeletal in nature.  Doubt infectious cause based on physical exam findings.  Suspect from overuse yesterday.  Will treat with NSAIDs, stretching and following up with PCP. Return precautions given.    Patient is hemodynamically stable, in NAD, and able to ambulate in the ED. Evaluation does not show pathology that would require ongoing emergent intervention or inpatient treatment. I explained the diagnosis to the patient. Pain has been managed and has no complaints prior to discharge. Patient is comfortable with above plan and is stable for discharge at this time. All questions were answered prior to disposition. Strict return precautions for returning to the ED were discussed. Encouraged follow up with PCP.   An After Visit Summary was printed and given to the patient.   Portions of this note were generated with Scientist, clinical (histocompatibility and immunogenetics). Dictation errors may occur despite best attempts at proofreading.  Final Clinical Impression(s) / ED Diagnoses Final diagnoses:  Muscle strain    Rx / DC Orders ED Discharge Orders          Ordered    diclofenac Sodium (VOLTAREN) 1 % GEL  4  times daily        06/08/21 1740             Dietrich Pates, PA-C 06/08/21 1740    Long, Arlyss Repress, MD 06/08/21 1755

## 2021-06-08 NOTE — Discharge Instructions (Signed)
Use of Voltaren gel as directed. Take Tylenol and ibuprofen as needed to help with symptoms. Follow-up with your primary care provider. Return to the ER if you start to experience worsening pain, swelling, injuries or falls, fever.

## 2021-06-08 NOTE — ED Triage Notes (Signed)
Pain to the left leg superior and medial to the knee, patient reports it started hurting about 1100, he iced it and took a nap and decided to come in after it did not improve.  Denies recent injury.

## 2021-06-08 NOTE — ED Notes (Signed)
Pt discharged to home. Discharge instructions have been discussed with patient and/or family members. Pt verbally acknowledges understanding d/c instructions, and endorses comprehension to checkout at registration before leaving.  °

## 2021-11-20 ENCOUNTER — Other Ambulatory Visit: Payer: Self-pay | Admitting: Family Medicine

## 2021-11-20 DIAGNOSIS — R1032 Left lower quadrant pain: Secondary | ICD-10-CM

## 2021-12-06 ENCOUNTER — Ambulatory Visit
Admission: RE | Admit: 2021-12-06 | Discharge: 2021-12-06 | Disposition: A | Discharge status: Home / Self Care | Payer: 59 | Source: Ambulatory Visit | Attending: Family Medicine | Admitting: Family Medicine

## 2021-12-06 DIAGNOSIS — R1032 Left lower quadrant pain: Secondary | ICD-10-CM

## 2021-12-27 IMAGING — US US EXTREM LOW VENOUS*L*
1 series · 14 of 24 positions shown · non-contrast
Comparison: None.

CLINICAL DATA: Posterior knee pain.

EXAM:
LEFT LOWER EXTREMITY VENOUS DOPPLER ULTRASOUND
TECHNIQUE: Gray-scale sonography with compression, as well as color and duplex
ultrasound, were performed to evaluate the deep venous system(s)
from the level of the common femoral vein through the popliteal and
proximal calf veins.

[Series 1: us extrem low venous*left* · 14 of 40 slices shown]
[im 1/40]
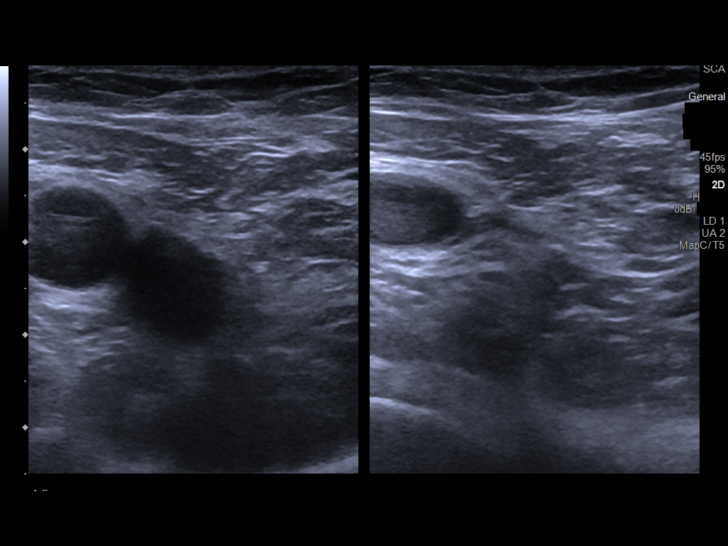
[im 4/40]
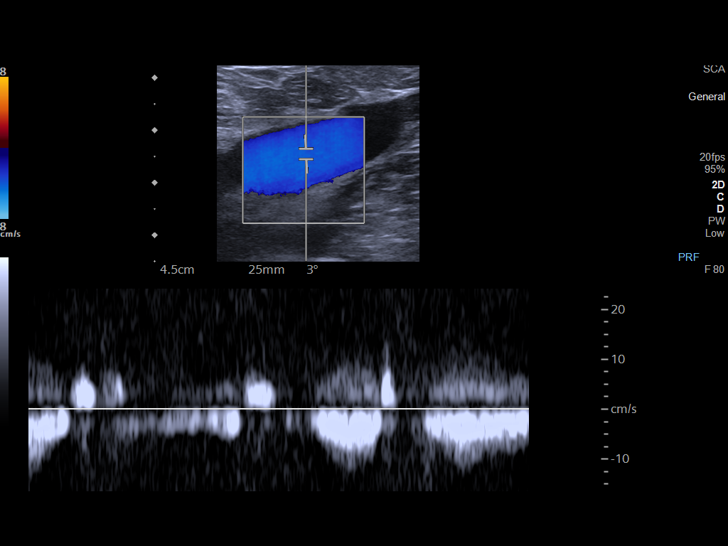
[im 7/40]
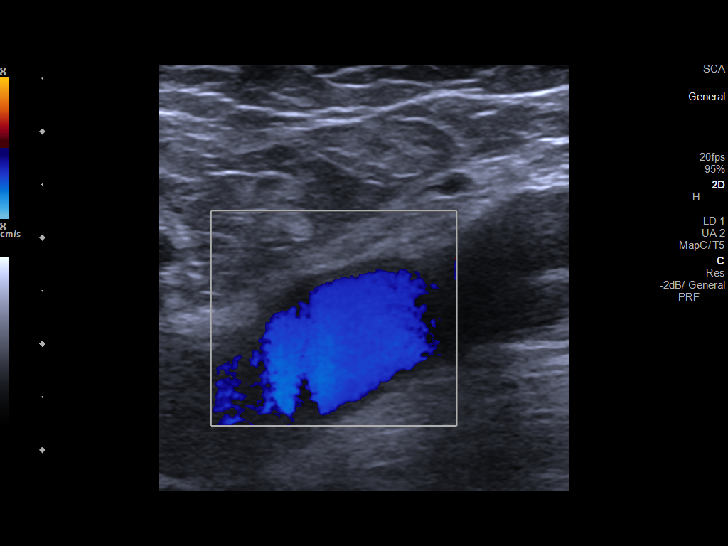
[im 11/40]
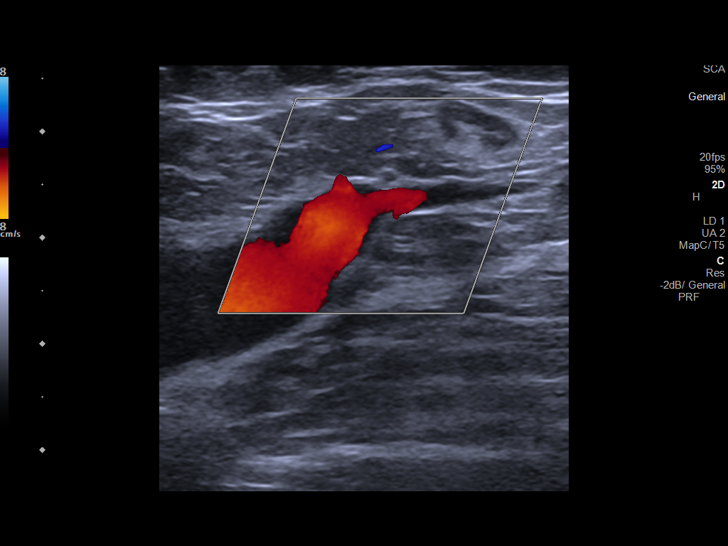
[im 12/40]
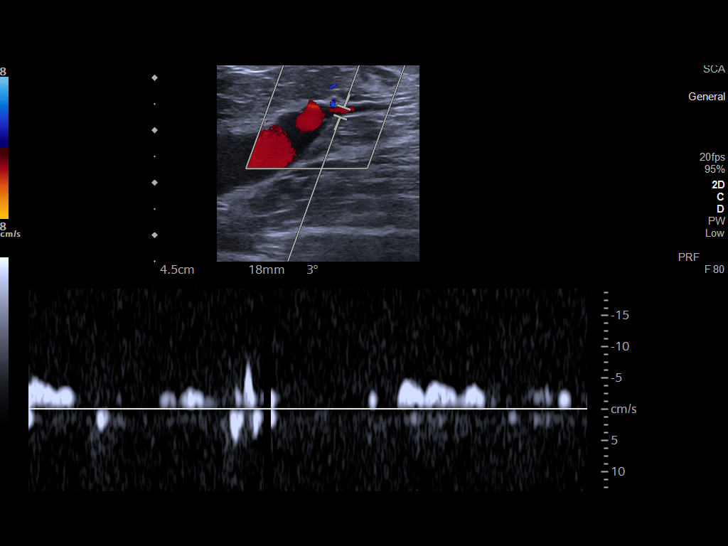
[im 16/40]
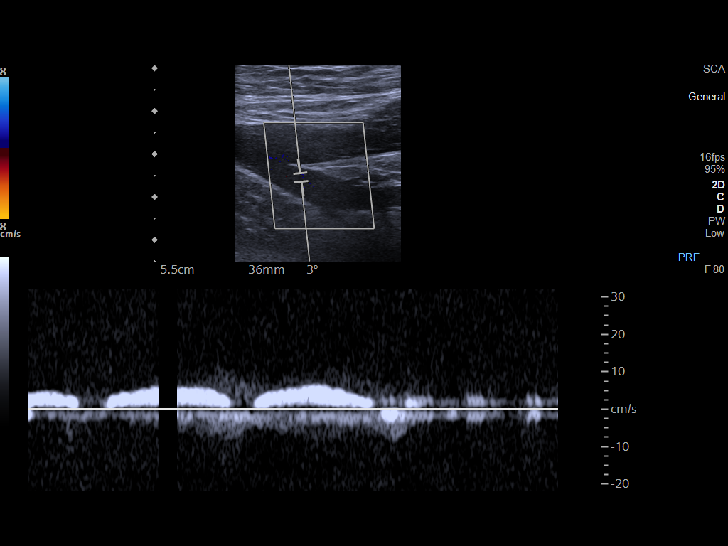
[im 19/40]
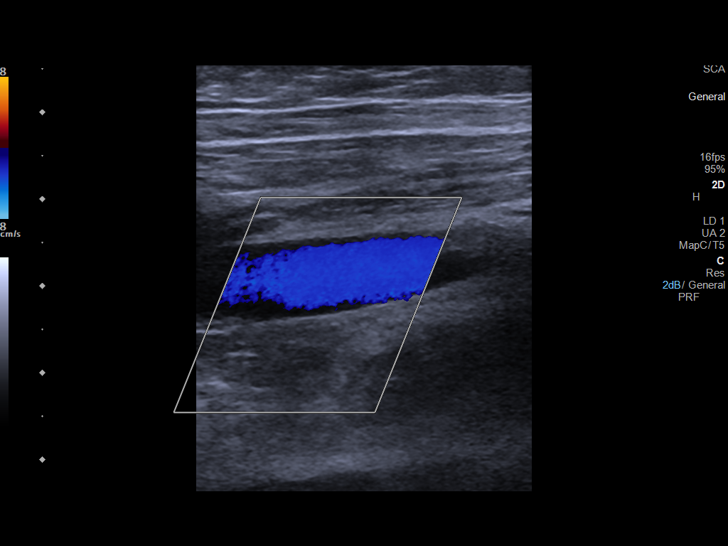
[im 21/40]
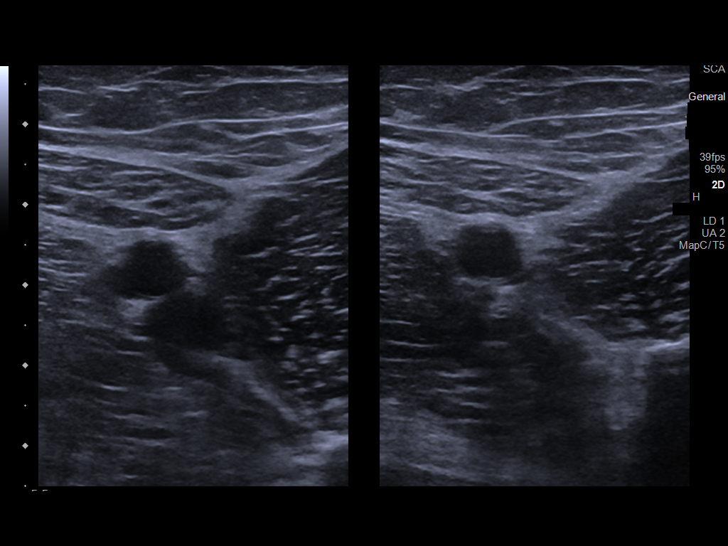
[im 24/40]
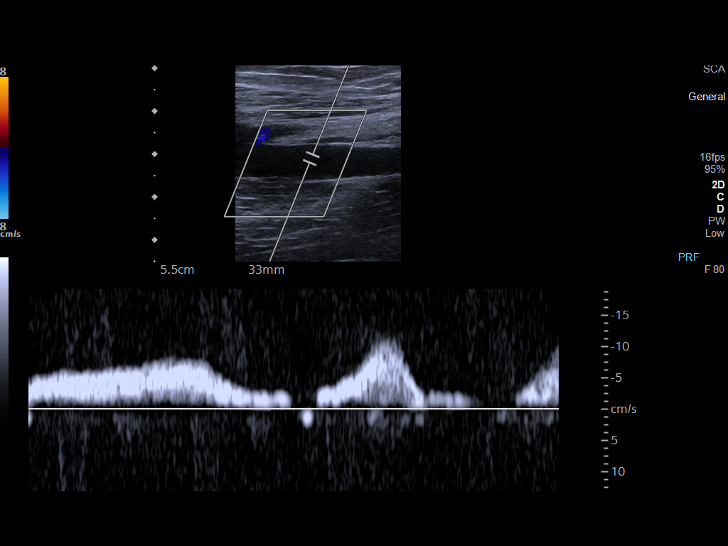
[im 28/40]
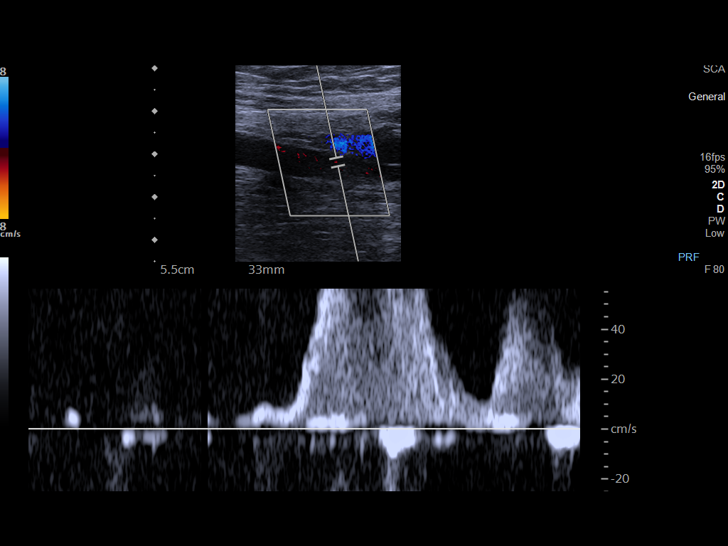
[im 31/40]
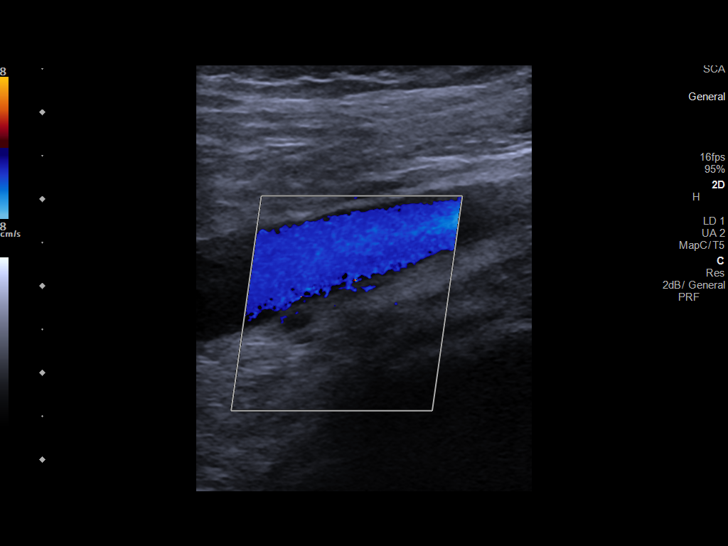
[im 33/40]
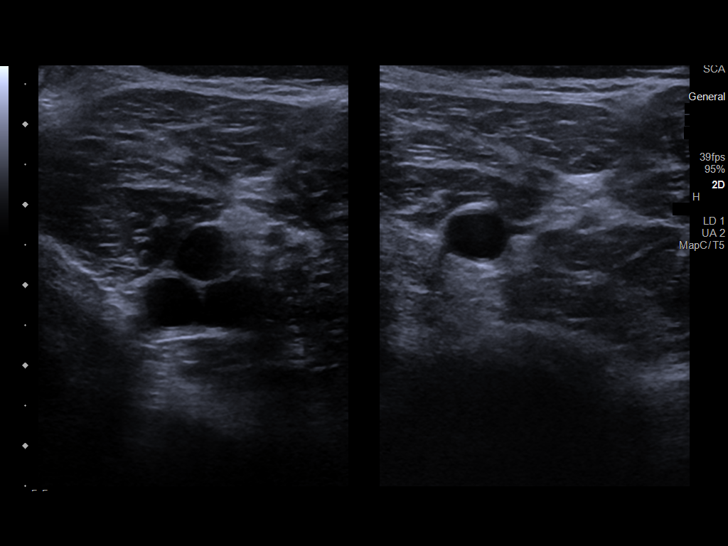
[im 36/40]
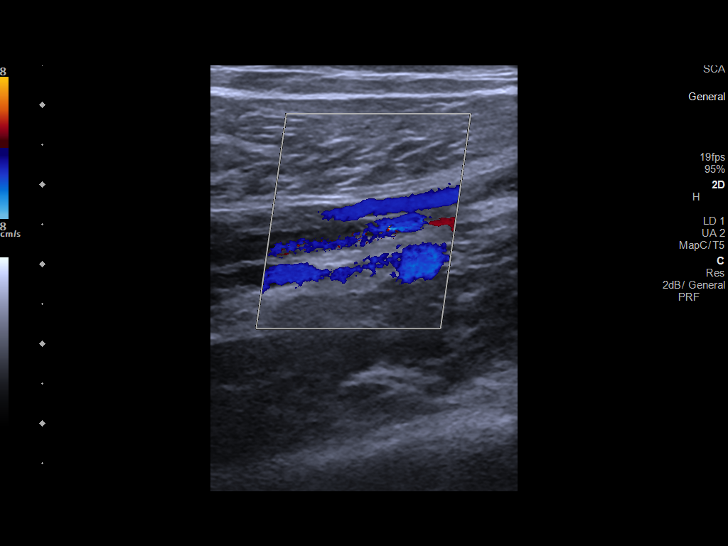
[im 40/40]
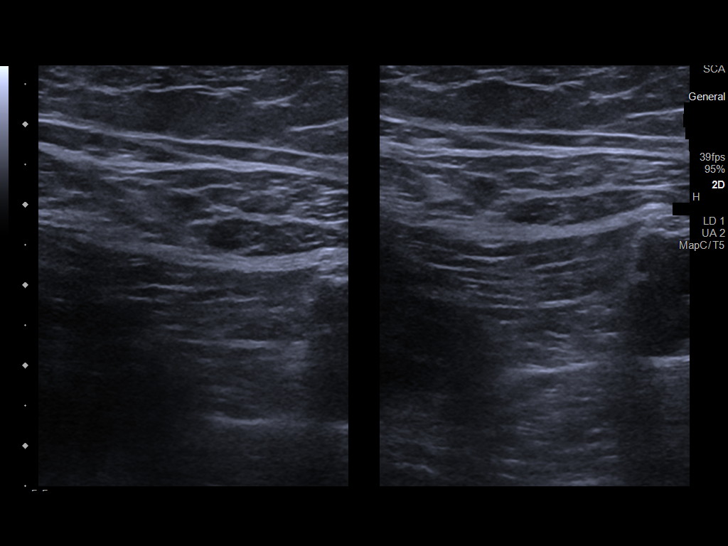

[14 of 24 positions shown; findings below may reference images not displayed]

FINDINGS: VENOUS

Normal compressibility of the common femoral, superficial femoral,
and popliteal veins, as well as the visualized calf veins.
Visualized portions of profunda femoral vein and great saphenous
vein unremarkable. No filling defects to suggest DVT on grayscale or
color Doppler imaging. Doppler waveforms show normal direction of
venous flow, normal respiratory plasticity and response to
augmentation.

Limited views of the contralateral common femoral vein are
unremarkable.

OTHER

No Baker's cyst demonstrated by the technologist.

Limitations: none
IMPRESSION: No evidence of left lower extremity DVT.

## 2022-06-26 IMAGING — US US PELVIS LIMITED
1 series · 14 of 18 positions shown · non-contrast
Comparison: None.

CLINICAL DATA: Left lower quadrant abdominal pain. Evaluate for
hernia.

EXAM:
US PELVIS LIMITED
TECHNIQUE: Grayscale sonographic imaging of the left inguinal region.

[Series 1: us pelvis limited · 0.06mm/px · 18 acquisitions, 14 frames shown]
[im 1/18]
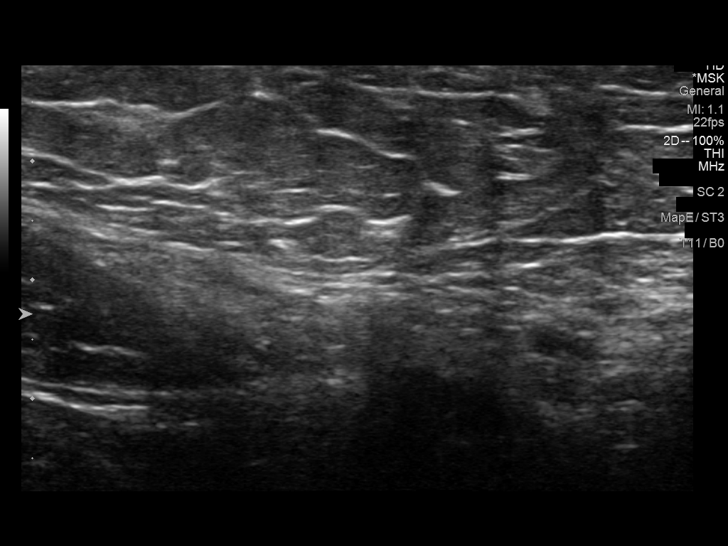
[im 2/18]
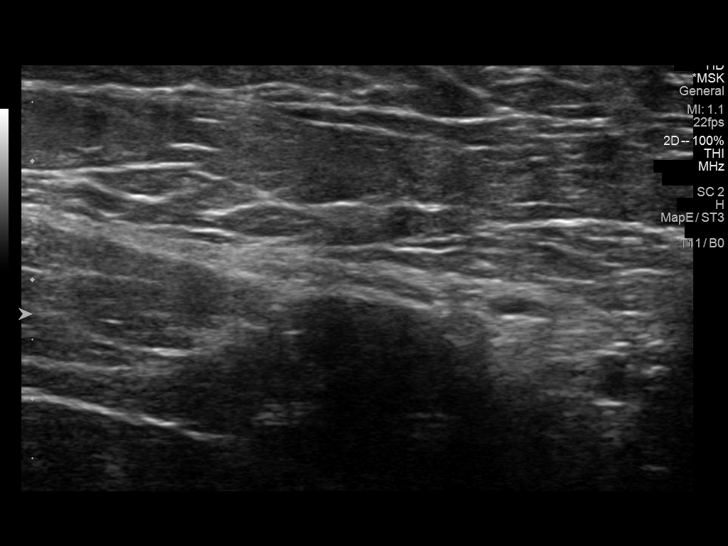
[im 4/18]
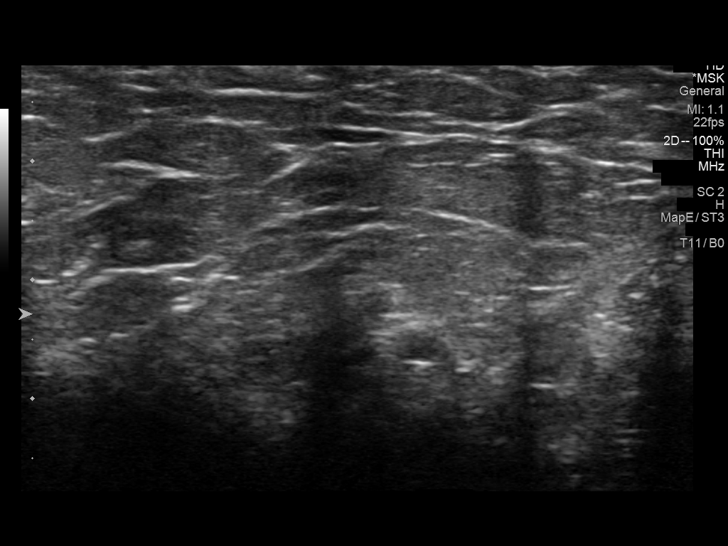
[im 5/18]
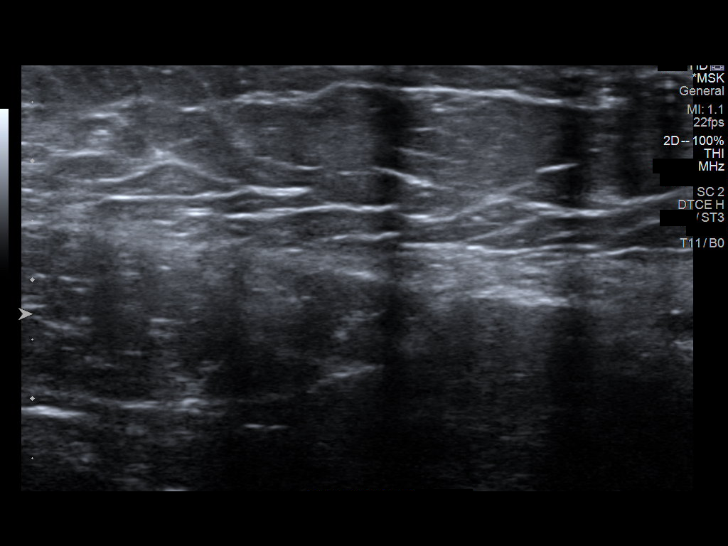
[im 6/18]
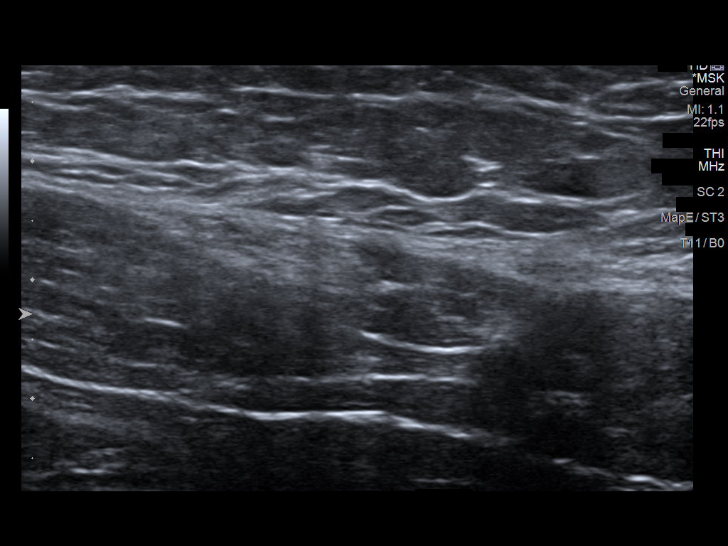
[im 8/18]
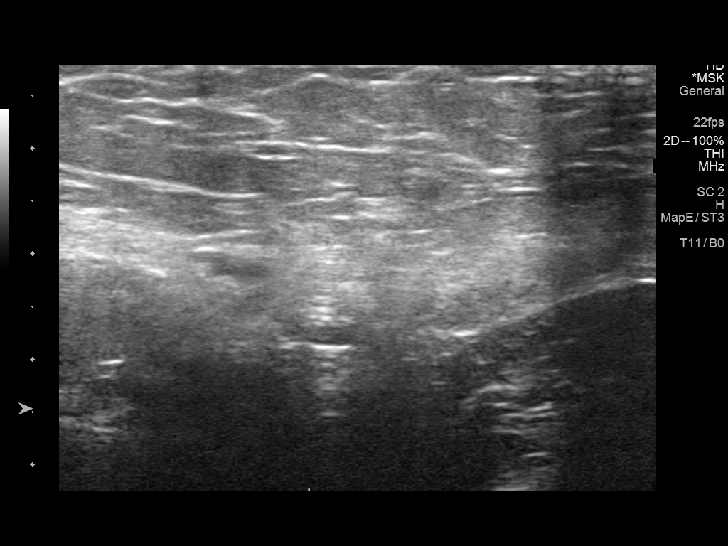
[im 9/18]
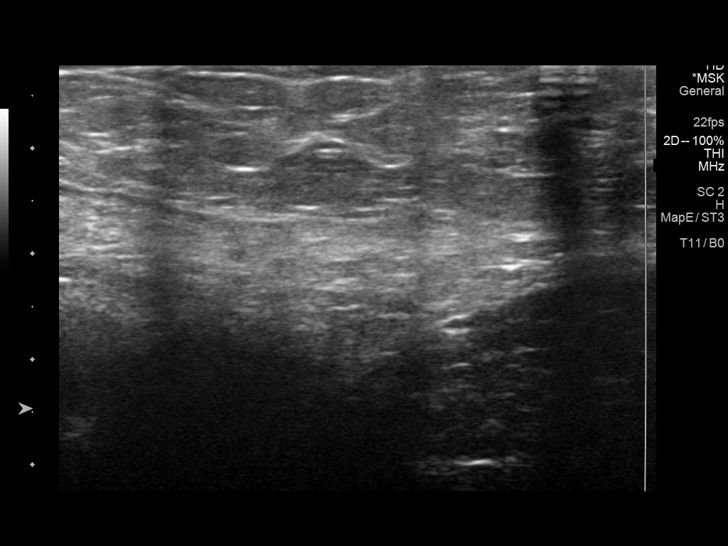
[im 10/18]
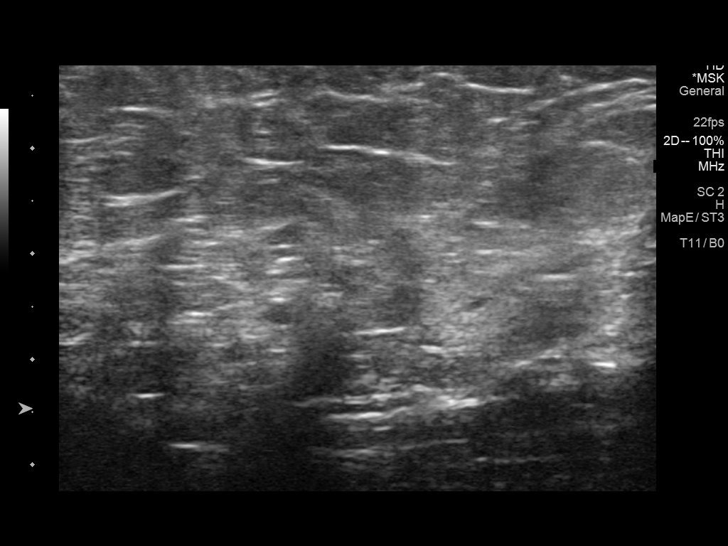
[im 11/18]
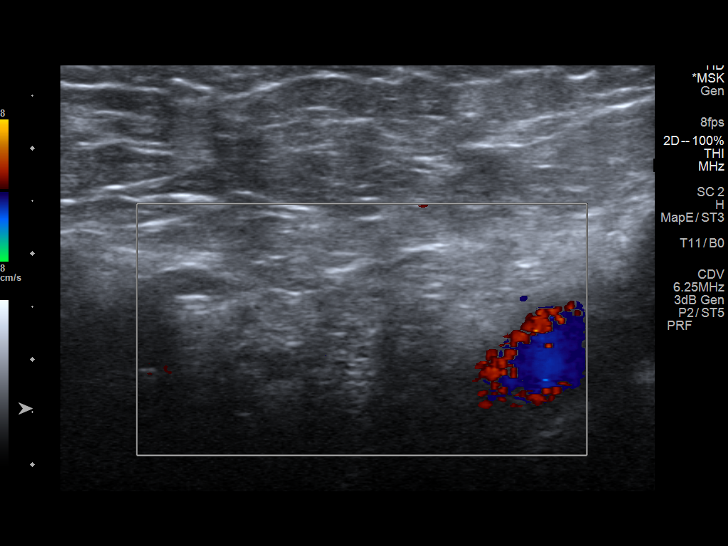
[im 13/18]
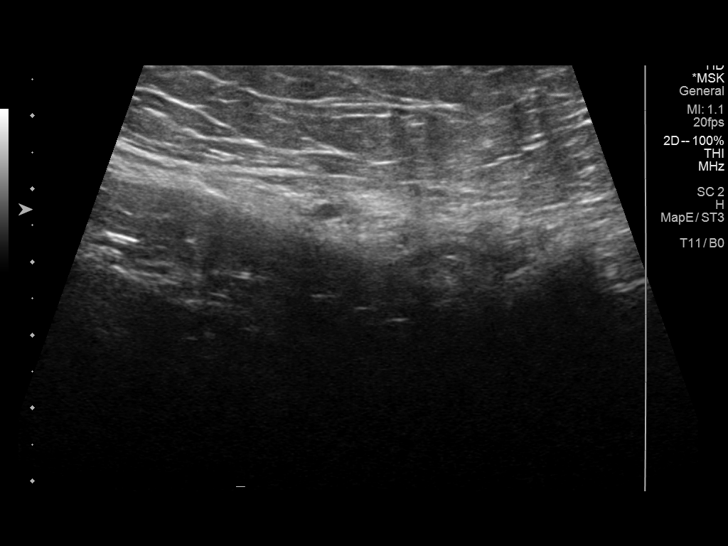
[im 14/18]
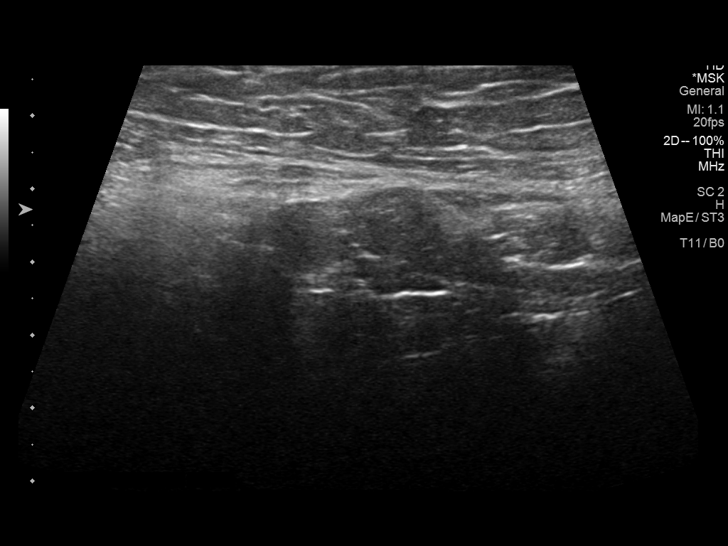
[im 15/18]
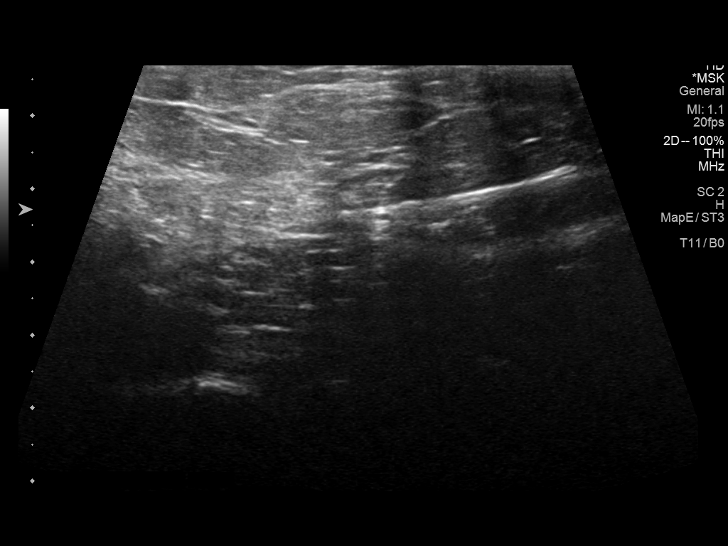
[im 17/18]
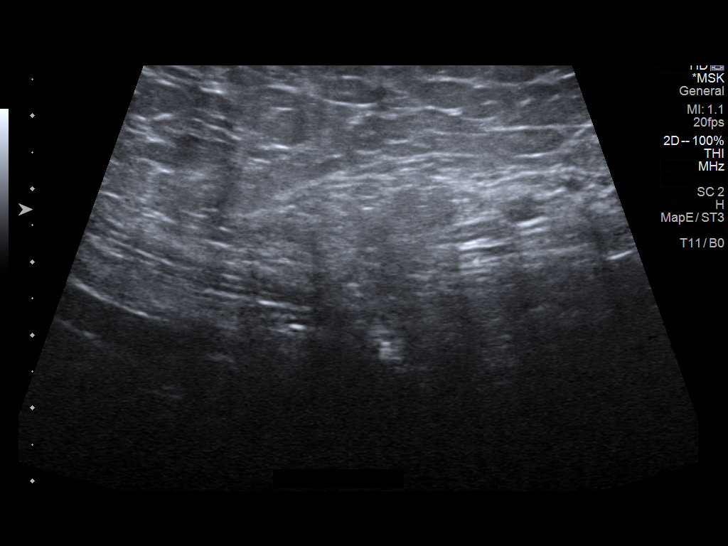
[im 18/18]
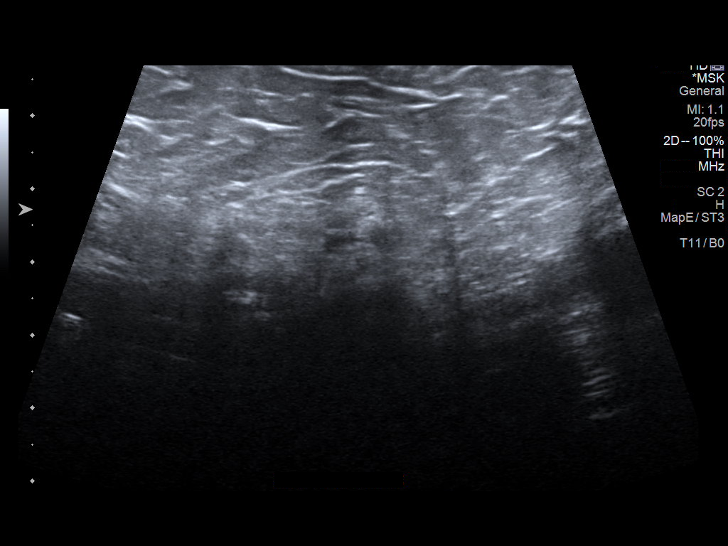

[14 of 18 positions shown; findings below may reference images not displayed]

FINDINGS: No hernia, cyst or soft tissue mass identified in the left inguinal
region.
IMPRESSION: Negative.

## 2022-11-19 ENCOUNTER — Other Ambulatory Visit: Payer: Self-pay | Admitting: Physician Assistant

## 2022-11-19 ENCOUNTER — Ambulatory Visit
Admission: RE | Admit: 2022-11-19 | Discharge: 2022-11-19 | Disposition: A | Discharge status: Home / Self Care | Payer: Managed Care, Other (non HMO) | Source: Ambulatory Visit | Attending: Physician Assistant | Admitting: Physician Assistant

## 2022-11-19 DIAGNOSIS — R059 Cough, unspecified: Secondary | ICD-10-CM

## 2024-09-26 ENCOUNTER — Emergency Department (HOSPITAL_BASED_OUTPATIENT_CLINIC_OR_DEPARTMENT_OTHER)
Admission: EM | Admit: 2024-09-26 | Discharge: 2024-09-26 | Disposition: A | Discharge status: Home / Self Care | Attending: Emergency Medicine | Admitting: Emergency Medicine

## 2024-09-26 ENCOUNTER — Emergency Department (HOSPITAL_BASED_OUTPATIENT_CLINIC_OR_DEPARTMENT_OTHER)

## 2024-09-26 ENCOUNTER — Other Ambulatory Visit: Payer: Self-pay

## 2024-09-26 ENCOUNTER — Encounter (HOSPITAL_BASED_OUTPATIENT_CLINIC_OR_DEPARTMENT_OTHER): Payer: Self-pay | Admitting: Emergency Medicine

## 2024-09-26 DIAGNOSIS — K2900 Acute gastritis without bleeding: Secondary | ICD-10-CM | POA: Diagnosis not present

## 2024-09-26 DIAGNOSIS — R1013 Epigastric pain: Secondary | ICD-10-CM | POA: Diagnosis present

## 2024-09-26 LAB — BASIC METABOLIC PANEL WITH GFR
Anion gap: 12 (ref 5–15)
BUN: 15 mg/dL (ref 6–20)
CO2: 27 mmol/L (ref 22–32)
Calcium: 9.3 mg/dL (ref 8.9–10.3)
Chloride: 104 mmol/L (ref 98–111)
Creatinine, Ser: 0.96 mg/dL (ref 0.61–1.24)
GFR, Estimated: >60 mL/min (ref >60)
Glucose, Bld: 113 mg/dL — ABNORMAL HIGH (ref 70–99)
Potassium: 3.9 mmol/L (ref 3.5–5.1)
Sodium: 143 mmol/L (ref 135–145)

## 2024-09-26 LAB — CBC
HCT: 44.2 % (ref 39.0–52.0)
Hemoglobin: 15.4 g/dL (ref 13.0–17.0)
MCH: 31.6 pg (ref 26.0–34.0)
MCHC: 34.8 g/dL (ref 30.0–36.0)
MCV: 90.8 fL (ref 80.0–100.0)
Platelets: 273 K/uL (ref 150–400)
RBC: 4.87 MIL/uL (ref 4.22–5.81)
RDW: 12.6 % (ref 11.5–15.5)
WBC: 6.7 K/uL (ref 4.0–10.5)
nRBC: 0 % (ref 0.0–0.2)

## 2024-09-26 LAB — HEPATIC FUNCTION PANEL
ALT: 18 U/L (ref 0–44)
AST: 21 U/L (ref 15–41)
Albumin: 4.8 g/dL (ref 3.5–5.0)
Alkaline Phosphatase: 84 U/L (ref 38–126)
Bilirubin, Direct: 0.2 mg/dL (ref 0.0–0.2)
Indirect Bilirubin: 0.2 mg/dL — ABNORMAL LOW (ref 0.3–0.9)
Total Bilirubin: 0.4 mg/dL (ref 0.0–1.2)
Total Protein: 7 g/dL (ref 6.5–8.1)

## 2024-09-26 LAB — TROPONIN T, HIGH SENSITIVITY
Troponin T High Sensitivity: <15 ng/L (ref 0–19)
Troponin T High Sensitivity: <15 ng/L (ref 0–19)

## 2024-09-26 LAB — LIPASE, BLOOD: Lipase: 36 U/L (ref 11–51)

## 2024-09-26 MED ORDER — PANTOPRAZOLE SODIUM 40 MG PO TBEC: 40.0000 mg | DELAYED_RELEASE_TABLET | Freq: Every day | ORAL | 0 refills | Status: AC

## 2024-09-26 MED ORDER — ALUM & MAG HYDROXIDE-SIMETH 200-200-20 MG/5ML PO SUSP
30.0000 mL | Freq: Once | ORAL | Status: AC
  Administered 2024-09-26: 30 mL via ORAL
  Filled 2024-09-26: qty 30

## 2024-09-26 MED ORDER — PANTOPRAZOLE SODIUM 40 MG PO TBEC
40.0000 mg | DELAYED_RELEASE_TABLET | Freq: Once | ORAL | Status: AC
  Administered 2024-09-26: 40 mg via ORAL
  Filled 2024-09-26: qty 1

## 2024-09-26 NOTE — ED Provider Notes (Signed)
 [facility] Provider Note   CSN: [id number] Arrival date & time: 09/26/24  1402     Patient presents with: Chest Pain   Patrick Cabrera is a 48 y.o. male.   HPI 48 year old male presents with inferior chest/epigastric pain. Originally had a severe episode 9 days ago.  He has been having some intermittent mild discomfort from time to time but will respond to Tums.  He does note a history of an ulcer diagnosed about 10 years ago that was attributed to ibuprofen.  He does occasionally take ibuprofen for back pain but has not been recently.  However today, after lunch around 12:30 PM he developed severe recurrent pain just inferior to the sternum.  The pain was both sharp and dull.  Did not radiate.  No vomiting, diarrhea, urinary symptoms.  No recent black or bloody stools.  No shortness of breath or radiation of the pain.  The patient states that he has had some mildly elevated blood pressures in the office but has not been diagnosed with hypertension.  No history of smoking.  Pain right now is a lot better but not gone.  Prior to Admission medications   Medication Sig Start Date End Date Taking? Authorizing Provider  pantoprazole (PROTONIX) 40 MG tablet Take 1 tablet (40 mg total) by mouth daily. 09/26/24  Yes Freddi Hamilton, MD  acetaminophen  (TYLENOL ) 500 MG tablet Take 500 mg by mouth every 6 (six) hours as needed.    [provider]  azelastine (ASTELIN) 0.1 % nasal spray Place into both nostrils 2 (two) times daily. Use in each nostril as directed Patient not taking: Reported on 02/08/2021    [provider]  Budesonide  90 MCG/ACT inhaler Inhale 2 puffs into the lungs 2 (two) times daily. 02/08/21   Waddell Rake, MD  diclofenac  Sodium (VOLTAREN ) 1 % GEL Apply 2 g topically 4 (four) times daily. 06/08/21   Khatri, Hina, PA-C  fluticasone (FLONASE) 50 MCG/ACT nasal spray Place into both nostrils daily. Patient not taking: Reported  on 02/08/2021    [provider]  oxyCODONE  (OXY IR/ROXICODONE ) 5 MG immediate release tablet Take 1-2 tablets (5-10 mg total) by mouth every 4 (four) hours as needed for moderate pain, severe pain or breakthrough pain. Patient not taking: Reported on 02/08/2021 02/16/15   Tanda Locus, MD    Allergies: Patient has no known allergies.    Review of Systems  Respiratory:  Negative for shortness of breath.   Cardiovascular:  Negative for chest pain.  Gastrointestinal:  Positive for abdominal pain. Negative for blood in stool and vomiting.    Updated Vital Signs BP (!) 149/99 (BP Location: Right Arm)   Pulse (!) 54   Temp 97.9 F (36.6 C) (Oral)   Resp 18   Ht 5' 10 (1.778 m)   Wt 90.7 kg   SpO2 97%   BMI 28.70 kg/m   Physical Exam Vitals and nursing note reviewed.  Constitutional:      Appearance: He is well-developed.  HENT:     Head: Normocephalic and atraumatic.  Cardiovascular:     Rate and Rhythm: Normal rate and regular rhythm.     Heart sounds: Normal heart sounds.  Pulmonary:     Effort: Pulmonary effort is normal.     Breath sounds: Normal breath sounds.  Abdominal:     Palpations: Abdomen is soft.     Tenderness: There is abdominal tenderness (mild).   Skin:  General: Skin is warm and dry.  Neurological:     Mental Status: He is alert.     (all labs ordered are listed, but only abnormal results are displayed) Labs Reviewed  BASIC METABOLIC PANEL WITH GFR - Abnormal; Notable for the following components:      Result Value   Glucose, Bld 113 (*)    All other components within normal limits  HEPATIC FUNCTION PANEL - Abnormal; Notable for the following components:   Indirect Bilirubin 0.2 (*)    All other components within normal limits  CBC  LIPASE, BLOOD  TROPONIN T, HIGH SENSITIVITY  TROPONIN T, HIGH SENSITIVITY    EKG: EKG Interpretation Date/Time:  Monday September 26 2024 14:09:05 EDT Ventricular Rate:  64 PR Interval:  150 QRS  Duration:  100 QT Interval:  415 QTC Calculation: 429 R Axis:   56  Text Interpretation: Sinus rhythm Probable left atrial enlargement RSR' in V1 or V2, probably normal variant Confirmed by Randol Simmonds [phone]) on 09/26/2024 2:13:58 PM  Radiology: US  Abdomen Limited RUQ (LIVER/GB) Result Date: 09/26/2024 CLINICAL DATA:  Epigastric pain EXAM: ULTRASOUND ABDOMEN LIMITED RIGHT UPPER QUADRANT COMPARISON:  None Available. FINDINGS: Gallbladder: No gallstones or wall thickening visualized. No sonographic Murphy sign noted by sonographer. Common bile duct: Diameter: 4 mm Liver: No focal lesion identified. Within normal limits in parenchymal echogenicity. Portal vein is patent on color Doppler imaging with normal direction of blood flow towards the liver. Other: None. IMPRESSION: Normal right upper quadrant ultrasound examination. Electronically Signed   By: Limin  Xu M.D.   On: 09/26/2024 16:41   DG Chest 2 View Result Date: 09/26/2024 EXAM: 2 VIEW(S) XRAY OF THE CHEST 09/26/2024 02:25:00 PM COMPARISON: 11/19/22. CLINICAL HISTORY: Chest pain. Pt c/o epigastric pain x 2 weeks, worse today. Denies n/v/diaphoresis/shob. FINDINGS: LUNGS AND PLEURA: No focal pulmonary opacity. No pulmonary edema. No pleural effusion. No pneumothorax. HEART AND MEDIASTINUM: No acute abnormality of the cardiac and mediastinal silhouettes. BONES AND SOFT TISSUES: No acute osseous abnormality. IMPRESSION: 1. No acute process. Electronically signed by: Waddell Calk MD 09/26/2024 03:35 PM EDT RP Workstation: [id number]     Procedures   Medications Ordered in the ED  pantoprazole (PROTONIX) EC tablet 40 mg (40 mg Oral Given 09/26/24 1612)  alum & mag hydroxide-simeth (MAALOX/MYLANTA) 200-200-20 MG/5ML suspension 30 mL (30 mLs Oral Given 09/26/24 1612)                                    Medical Decision Making Amount and/or Complexity of Data Reviewed Labs: ordered.    Details: Normal troponin x 2 Radiology: ordered and  independent interpretation performed.    Details: No pneumonia, no cholelithiasis ECG/medicine tests: ordered and independent interpretation performed.    Details: No ischemia  Risk OTC drugs. Prescription drug management.   Workup is overall unremarkable including normal LFTs, lipase, troponins.  ECG without acute ischemia.  Ultrasound does not show any cholelithiasis.  I suspect he is having gastritis.  He is feeling better with a GI cocktail.  Will prescribe him Protonix and recommend avoiding alcohol, NSAIDs, spicy foods, etc.  Otherwise, no signs or symptoms of bleeding such as melena.  Will refer him to GI as an outpatient.  His abdominal exam is benign, I do not think a CT is needed, very low concern for perforation or other acute intra-abdominal emergency.  Will discharge home with return precautions.  Final diagnoses:  Acute gastritis without hemorrhage, unspecified gastritis type    ED Discharge Orders          Ordered    pantoprazole (PROTONIX) 40 MG tablet  Daily        09/26/24 1716               Freddi Hamilton, MD 09/26/24 1735

## 2024-09-26 NOTE — ED Notes (Signed)
 Pt transferred from [facility] to ED RM 2. Assuming pt care at this time.

## 2024-09-26 NOTE — ED Triage Notes (Signed)
 Pt c/o epigastric pain x 2 weeks, worse today. Denies n/v/diaphoresis/shob.   OTC antacid ineffective.

## 2024-09-26 NOTE — Discharge Instructions (Signed)
 We are putting you on a medication called Protonix.  This helps reduce acid in your stomach, similar to Prilosec.  If you develop recurrent pain you may take Pepcid or Tums.  For now, avoid alcohol, NSAIDs such as ibuprofen, spicy foods, etc.  Call the gastroenterology office listed for urgent outpatient follow-up.  You may also follow-up with your primary care provider.  However if you develop new or worsening abdominal pain, bloody or black stools, vomiting, or any other new/concerning symptoms then return to the ER.

## 2024-09-26 NOTE — ED Notes (Signed)
 Pt transported to ultrasound at this time. Medication administration delayed d/t this.
